# Patient Record
Sex: Male | Born: 1950 | Race: White | Hispanic: No | Smoking: Former smoker
Health system: Southern US, Community
[De-identification: ages and names within clinical notes are randomized; demographics above are authoritative.]

## PROBLEM LIST (undated history)

## (undated) DIAGNOSIS — I5189 Other ill-defined heart diseases: Secondary | ICD-10-CM

## (undated) DIAGNOSIS — C61 Malignant neoplasm of prostate: Secondary | ICD-10-CM

## (undated) DIAGNOSIS — F32A Depression, unspecified: Secondary | ICD-10-CM

## (undated) DIAGNOSIS — I10 Essential (primary) hypertension: Secondary | ICD-10-CM

## (undated) DIAGNOSIS — A6920 Lyme disease, unspecified: Secondary | ICD-10-CM

## (undated) DIAGNOSIS — E785 Hyperlipidemia, unspecified: Secondary | ICD-10-CM

## (undated) DIAGNOSIS — M109 Gout, unspecified: Secondary | ICD-10-CM

## (undated) DIAGNOSIS — Z789 Other specified health status: Secondary | ICD-10-CM

## (undated) DIAGNOSIS — F329 Major depressive disorder, single episode, unspecified: Secondary | ICD-10-CM

## (undated) DIAGNOSIS — I2510 Atherosclerotic heart disease of native coronary artery without angina pectoris: Secondary | ICD-10-CM

## (undated) HISTORY — DX: Depression, unspecified: F32.A

## (undated) HISTORY — PX: OTHER SURGICAL HISTORY: SHX169

## (undated) HISTORY — DX: Essential (primary) hypertension: I10

## (undated) HISTORY — DX: Other specified health status: Z78.9

## (undated) HISTORY — DX: Other ill-defined heart diseases: I51.89

## (undated) HISTORY — DX: Gout, unspecified: M10.9

## (undated) HISTORY — DX: Major depressive disorder, single episode, unspecified: F32.9

## (undated) HISTORY — DX: Lyme disease, unspecified: A69.20

## (undated) HISTORY — PX: CORONARY ANGIOPLASTY: SHX604

## (undated) HISTORY — DX: Atherosclerotic heart disease of native coronary artery without angina pectoris: I25.10

## (undated) HISTORY — DX: Malignant neoplasm of prostate: C61

## (undated) HISTORY — DX: Hyperlipidemia, unspecified: E78.5

---

## 2006-05-06 ENCOUNTER — Ambulatory Visit: Payer: Self-pay | Admitting: Dermatology

## 2010-10-22 DIAGNOSIS — I2510 Atherosclerotic heart disease of native coronary artery without angina pectoris: Secondary | ICD-10-CM

## 2010-10-22 HISTORY — DX: Atherosclerotic heart disease of native coronary artery without angina pectoris: I25.10

## 2010-11-22 HISTORY — PX: CORONARY ARTERY BYPASS GRAFT: SHX141

## 2010-12-22 ENCOUNTER — Inpatient Hospital Stay: Payer: Self-pay | Admitting: Internal Medicine

## 2010-12-25 HISTORY — PX: CARDIAC CATHETERIZATION: SHX172

## 2010-12-30 ENCOUNTER — Emergency Department: Payer: Self-pay | Admitting: Unknown Physician Specialty

## 2011-04-11 ENCOUNTER — Inpatient Hospital Stay: Payer: Self-pay | Admitting: Internal Medicine

## 2012-05-12 ENCOUNTER — Emergency Department: Payer: Self-pay | Admitting: Emergency Medicine

## 2012-05-18 ENCOUNTER — Emergency Department: Payer: Self-pay | Admitting: Unknown Physician Specialty

## 2012-06-24 LAB — CBC WITH DIFFERENTIAL/PLATELET
Basophil #: 0.1 10*3/uL (ref 0.0–0.1)
Basophil %: 0.9 %
Eosinophil #: 0.3 10*3/uL (ref 0.0–0.7)
Eosinophil %: 3.9 %
Lymphocyte #: 1.6 10*3/uL (ref 1.0–3.6)
MCH: 30.6 pg (ref 26.0–34.0)
MCHC: 35.2 g/dL (ref 32.0–36.0)
MCV: 87 fL (ref 80–100)
Monocyte #: 0.7 x10 3/mm (ref 0.2–1.0)
Neutrophil #: 5.4 10*3/uL (ref 1.4–6.5)
Platelet: 210 10*3/uL (ref 150–440)
RBC: 4.83 10*6/uL (ref 4.40–5.90)
RDW: 13.1 % (ref 11.5–14.5)

## 2012-06-24 LAB — COMPREHENSIVE METABOLIC PANEL
Albumin: 3.9 g/dL (ref 3.4–5.0)
Alkaline Phosphatase: 92 U/L (ref 50–136)
Anion Gap: 9 (ref 7–16)
BUN: 16 mg/dL (ref 7–18)
Bilirubin,Total: 0.3 mg/dL (ref 0.2–1.0)
Chloride: 105 mmol/L (ref 98–107)
Creatinine: 1.05 mg/dL (ref 0.60–1.30)
Glucose: 100 mg/dL — ABNORMAL HIGH (ref 65–99)
SGOT(AST): 17 U/L (ref 15–37)
SGPT (ALT): 25 U/L (ref 12–78)
Sodium: 138 mmol/L (ref 136–145)
Total Protein: 7.2 g/dL (ref 6.4–8.2)

## 2012-06-24 LAB — TROPONIN I: Troponin-I: <0.02 ng/mL

## 2012-06-24 LAB — CK TOTAL AND CKMB (NOT AT ARMC): CK-MB: 0.6 ng/mL (ref 0.5–3.6)

## 2012-06-25 ENCOUNTER — Inpatient Hospital Stay: Payer: Self-pay | Admitting: Student

## 2012-06-25 LAB — CK TOTAL AND CKMB (NOT AT ARMC)
CK, Total: 83 U/L (ref 35–232)
CK, Total: 76 U/L (ref 35–232)
CK-MB: 0.6 ng/mL (ref 0.5–3.6)

## 2012-06-25 LAB — TROPONIN I: Troponin-I: <0.02 ng/mL

## 2012-06-26 LAB — RAPID URINE DRUG SCREEN, HOSP PERFORMED
Amphetamines, Ur Screen: NEGATIVE (ref ?–1000)
Barbiturates, Ur Screen: NEGATIVE (ref ?–200)
Cocaine Metabolite,Ur ~~LOC~~: NEGATIVE (ref ?–300)
Opiate, Ur Screen: NEGATIVE (ref ?–300)

## 2012-06-26 LAB — BASIC METABOLIC PANEL
Anion Gap: 7 (ref 7–16)
BUN: 15 mg/dL (ref 7–18)
Calcium, Total: 9.3 mg/dL (ref 8.5–10.1)
Co2: 26 mmol/L (ref 21–32)
EGFR (African American): >60
Glucose: 103 mg/dL — ABNORMAL HIGH (ref 65–99)
Potassium: 4.1 mmol/L (ref 3.5–5.1)

## 2012-06-26 LAB — CBC
MCH: 30.1 pg (ref 26.0–34.0)
MCHC: 34.4 g/dL (ref 32.0–36.0)
Platelet: 209 10*3/uL (ref 150–440)
RDW: 13 % (ref 11.5–14.5)

## 2013-01-31 DIAGNOSIS — Z95820 Peripheral vascular angioplasty status with implants and grafts: Secondary | ICD-10-CM | POA: Insufficient documentation

## 2013-07-17 ENCOUNTER — Ambulatory Visit: Payer: Self-pay | Admitting: Gastroenterology

## 2013-07-21 ENCOUNTER — Emergency Department: Payer: Self-pay | Admitting: Emergency Medicine

## 2013-07-21 LAB — COMPREHENSIVE METABOLIC PANEL
BUN: 14 mg/dL (ref 7–18)
Calcium, Total: 9 mg/dL (ref 8.5–10.1)
Chloride: 105 mmol/L (ref 98–107)
Co2: 23 mmol/L (ref 21–32)
EGFR (African American): >60
Glucose: 106 mg/dL — ABNORMAL HIGH (ref 65–99)
Potassium: 4.2 mmol/L (ref 3.5–5.1)
SGOT(AST): 21 U/L (ref 15–37)
SGPT (ALT): 27 U/L (ref 12–78)
Sodium: 134 mmol/L — ABNORMAL LOW (ref 136–145)
Total Protein: 7 g/dL (ref 6.4–8.2)

## 2013-07-21 LAB — CBC
HCT: 43.1 % (ref 40.0–52.0)
HGB: 15.2 g/dL (ref 13.0–18.0)
MCH: 30.5 pg (ref 26.0–34.0)
MCHC: 35.3 g/dL (ref 32.0–36.0)
RDW: 12.8 % (ref 11.5–14.5)
WBC: 6.5 10*3/uL (ref 3.8–10.6)

## 2013-07-21 LAB — PATHOLOGY REPORT

## 2013-07-21 LAB — TROPONIN I: Troponin-I: <0.02 ng/mL

## 2013-07-22 DIAGNOSIS — G459 Transient cerebral ischemic attack, unspecified: Secondary | ICD-10-CM

## 2013-07-22 HISTORY — DX: Transient cerebral ischemic attack, unspecified: G45.9

## 2013-08-28 ENCOUNTER — Ambulatory Visit: Payer: Self-pay | Admitting: Gastroenterology

## 2013-11-05 ENCOUNTER — Ambulatory Visit: Payer: Self-pay | Admitting: Cardiovascular Disease

## 2013-11-23 ENCOUNTER — Ambulatory Visit: Payer: Self-pay | Admitting: Cardiovascular Disease

## 2013-11-23 ENCOUNTER — Encounter: Payer: Self-pay | Admitting: *Deleted

## 2013-11-30 ENCOUNTER — Emergency Department: Payer: Self-pay | Admitting: Emergency Medicine

## 2013-12-18 ENCOUNTER — Telehealth: Payer: Self-pay

## 2013-12-18 NOTE — Telephone Encounter (Signed)
LMOM FOR PT TO R/S MISSED APPT. ALSO RETURNED MAIL. NEED NEW ADDRESS

## 2014-04-22 ENCOUNTER — Ambulatory Visit: Payer: Self-pay | Admitting: Cardiovascular Disease

## 2014-05-06 ENCOUNTER — Ambulatory Visit (INDEPENDENT_AMBULATORY_CARE_PROVIDER_SITE_OTHER): Payer: BC Managed Care – PPO | Admitting: Cardiovascular Disease

## 2014-05-06 ENCOUNTER — Encounter (INDEPENDENT_AMBULATORY_CARE_PROVIDER_SITE_OTHER): Payer: Self-pay

## 2014-05-06 ENCOUNTER — Encounter: Payer: Self-pay | Admitting: Cardiovascular Disease

## 2014-05-06 VITALS — BP 140/90 | HR 64 | Ht 70.0 in | Wt 224.2 lb

## 2014-05-06 DIAGNOSIS — E785 Hyperlipidemia, unspecified: Secondary | ICD-10-CM

## 2014-05-06 DIAGNOSIS — I252 Old myocardial infarction: Secondary | ICD-10-CM

## 2014-05-06 DIAGNOSIS — R6884 Jaw pain: Secondary | ICD-10-CM

## 2014-05-06 DIAGNOSIS — Z789 Other specified health status: Secondary | ICD-10-CM

## 2014-05-06 DIAGNOSIS — I209 Angina pectoris, unspecified: Secondary | ICD-10-CM

## 2014-05-06 DIAGNOSIS — I25119 Atherosclerotic heart disease of native coronary artery with unspecified angina pectoris: Secondary | ICD-10-CM

## 2014-05-06 DIAGNOSIS — R079 Chest pain, unspecified: Secondary | ICD-10-CM

## 2014-05-06 DIAGNOSIS — I2510 Atherosclerotic heart disease of native coronary artery without angina pectoris: Secondary | ICD-10-CM

## 2014-05-06 DIAGNOSIS — G458 Other transient cerebral ischemic attacks and related syndromes: Secondary | ICD-10-CM

## 2014-05-06 DIAGNOSIS — Z951 Presence of aortocoronary bypass graft: Secondary | ICD-10-CM

## 2014-05-06 DIAGNOSIS — Z9861 Coronary angioplasty status: Secondary | ICD-10-CM

## 2014-05-06 DIAGNOSIS — Z955 Presence of coronary angioplasty implant and graft: Secondary | ICD-10-CM

## 2014-05-06 MED ORDER — EZETIMIBE 10 MG PO TABS: 10.0000 mg | ORAL_TABLET | Freq: Every day | ORAL | Status: DC

## 2014-05-06 MED ORDER — LISINOPRIL 20 MG PO TABS: 20.0000 mg | ORAL_TABLET | Freq: Every day | ORAL | Status: DC

## 2014-05-06 NOTE — Assessment & Plan Note (Signed)
Unsteady has had a difficult time with statins. This was reviewed with him. We will try zetia 10 mg again. Samples and co-pay card was given. If he is able to tolerate this, additional medication we could try would be WelChol. We do have coupon for this as well

## 2014-05-06 NOTE — Assessment & Plan Note (Signed)
Pain is atypical, worse when he twists his upper body. Likely musculoskeletal strain. Suggested he hold off on playing golf at this time. He does have very physical work and for now we'll try to avoid NSAIDs. He feels that it is slowly getting better

## 2014-05-06 NOTE — Assessment & Plan Note (Signed)
We have recommended to him that we need to be aggressive with his lipid panel. He is a nonsmoker, nondiabetic.

## 2014-05-06 NOTE — Assessment & Plan Note (Signed)
Possible TIA in October 2014. Recommended he continue on his aspirin. No significant carotid disease on ultrasound

## 2014-05-06 NOTE — Patient Instructions (Addendum)
You are doing well. Call the office if jaw pain gets worse  Please increase the lisinopril if needed for blood pressure Start with a 1/2 pill of lisinopril 20 mg daily Increase slowly up to 20 mg if needed  Please start zetia one piull per day   Please call us if you have new issues that need to be addressed before your next appt.  Your physician wants you to follow-up in: 6 months.  You will receive a reminder letter in the mail two months in advance. If you don't receive a letter, please call our office to schedule the follow-up appointment.

## 2014-05-06 NOTE — Assessment & Plan Note (Signed)
He does have occasional jaw pain. He seen a dentist. This is similar to previous anginal symptoms. We have offered a stress test but he prefers to monitor his symptoms for now and continue his followup with a dentist. We have suggested he call us if symptoms get worse, particularly with exertion

## 2014-05-06 NOTE — Assessment & Plan Note (Deleted)
We'll try to obtain the [facility] records , surgical report.

## 2014-05-06 NOTE — Progress Notes (Signed)
Patient ID: Todd Collins, male    DOB: 11-23-1950, 63 y.o.   MRN: [id number]  HPI Comments: Todd Collins is a 63 year old gentleman , patient of Todd Collins , with history of coronary artery disease, cardiac catheterization in March 2012 showing 50% mid LAD disease, 90% disease of a small diagonal, 50% proximal RCA disease, 70% mid RCA disease, 60-70% distal RCA disease, who represented several months later and was transferred to [facility] for a CABG x2 vessel, LIMA to the LAD, vein graft to the PDA 02/02/2011 who also reports having stent since then, who presents to establish care Notes indicate history of depression, He has significant statin intolerance  He reports that he has had some pain in the central mediastinal area radiating to the right underneath his ribs. It hurts when he twists his upper body. He has been playing golf recently but felt fine at the time. This pain has been stuttering, on and off, usually worse with twisting his upper body, sometimes with palpation. Occasionally has jaw pain. He has been seeing a dentist for his teeth. Previous anginal symptoms was jaw pain, neck pain.  Reports having a TIA in October 2014, blurry vision, were findings difficulty. CT scan of the head was normal. Echocardiogram and Holter performed. Holter showed normal sinus rhythm  Echocardiogram June 2012 showing ejection fraction 45% Echocardiogram October 2014 showing normal LV systolic function,  Stress test June 2012 showing mild inferior wall ischemia  Carotid ultrasound June 2012 showing no hemodynamically significant stenosis  EKG today showing normal sinus rhythm with rate 64 beats a minute, no significant ST or T wave changes Reports being unable to tolerate omeprazole as this caused cramping     Outpatient Encounter Prescriptions as of 05/06/2014  Medication Sig  . allopurinol (ZYLOPRIM) 300 MG tablet Take 150 mg by mouth daily.  Marland Kitchen aspirin EC 81 MG tablet Take 81 mg by mouth daily.    Marland Kitchen lisinopril (PRINIVIL,ZESTRIL) 20 MG tablet Take 1 tablet (20 mg total) by mouth daily.  . metoprolol (LOPRESSOR) 50 MG tablet Take 50 mg by mouth 2 (two) times daily.  . nitroGLYCERIN (NITROSTAT) 0.4 MG SL tablet Place 0.4 mg under the tongue every 5 (five) minutes as needed for chest pain.  . Omega-3 Fatty Acids (FISH OIL) 1000 MG CAPS Take by mouth 2 (two) times daily.  Marland Kitchen  lisinopril (PRINIVIL,ZESTRIL) 5 MG tablet Take 5 mg by mouth daily.    Review of Systems  Constitutional: Negative.   HENT: Negative.        Jaw pain  Eyes: Negative.   Respiratory: Negative.   Cardiovascular: Positive for chest pain.  Gastrointestinal: Negative.   Endocrine: Negative.   Musculoskeletal: Negative.   Skin: Negative.   Allergic/Immunologic: Negative.   Neurological: Negative.   Hematological: Negative.   Psychiatric/Behavioral: Negative.   All other systems reviewed and are negative.   BP 160/100  Pulse 64  Wt 224 lb 4 oz (101.719 kg)  Physical Exam  Nursing note and vitals reviewed. Constitutional: He is oriented to person, place, and time. He appears well-developed and well-nourished.  HENT:  Head: Normocephalic.  Nose: Nose normal.  Mouth/Throat: Oropharynx is clear and moist.  Eyes: Conjunctivae are normal. Pupils are equal, round, and reactive to light.  Neck: Normal range of motion. Neck supple. No JVD present.  Cardiovascular: Normal rate, regular rhythm, S1 normal, S2 normal, normal heart sounds and intact distal pulses.  Exam reveals no gallop and no friction rub.   No murmur  heard. Pulmonary/Chest: Effort normal and breath sounds normal. No respiratory distress. He has no wheezes. He has no rales. He exhibits no tenderness.  Abdominal: Soft. Bowel sounds are normal. He exhibits no distension. There is no tenderness.  Musculoskeletal: Normal range of motion. He exhibits no edema and no tenderness.  Lymphadenopathy:    He has no cervical adenopathy.  Neurological: He is  alert and oriented to person, place, and time. Coordination normal.  Skin: Skin is warm and dry. No rash noted. No erythema.  Psychiatric: He has a normal mood and affect. His behavior is normal. Judgment and thought content normal.      Assessment and Plan

## 2014-05-06 NOTE — Assessment & Plan Note (Signed)
We'll try to obtain his stent procedure records for our system.

## 2014-05-07 DIAGNOSIS — Z951 Presence of aortocoronary bypass graft: Secondary | ICD-10-CM | POA: Insufficient documentation

## 2014-05-07 NOTE — Assessment & Plan Note (Signed)
Surgical report indicates LIMA to the LAD, vein graft to the PDA We'll try to obtain intervention report as he states having a stent

## 2014-08-12 ENCOUNTER — Telehealth: Payer: Self-pay

## 2014-08-12 ENCOUNTER — Ambulatory Visit (INDEPENDENT_AMBULATORY_CARE_PROVIDER_SITE_OTHER): Payer: BC Managed Care – PPO | Admitting: Cardiovascular Disease

## 2014-08-12 ENCOUNTER — Encounter: Payer: Self-pay | Admitting: Cardiovascular Disease

## 2014-08-12 VITALS — BP 160/80 | HR 77 | Ht 68.5 in | Wt 216.5 lb

## 2014-08-12 DIAGNOSIS — Z889 Allergy status to unspecified drugs, medicaments and biological substances status: Secondary | ICD-10-CM

## 2014-08-12 DIAGNOSIS — Z951 Presence of aortocoronary bypass graft: Secondary | ICD-10-CM

## 2014-08-12 DIAGNOSIS — Z955 Presence of coronary angioplasty implant and graft: Secondary | ICD-10-CM

## 2014-08-12 DIAGNOSIS — R079 Chest pain, unspecified: Secondary | ICD-10-CM

## 2014-08-12 DIAGNOSIS — I25119 Atherosclerotic heart disease of native coronary artery with unspecified angina pectoris: Secondary | ICD-10-CM

## 2014-08-12 DIAGNOSIS — Z789 Other specified health status: Secondary | ICD-10-CM

## 2014-08-12 DIAGNOSIS — R6884 Jaw pain: Secondary | ICD-10-CM

## 2014-08-12 DIAGNOSIS — E785 Hyperlipidemia, unspecified: Secondary | ICD-10-CM

## 2014-08-12 MED ORDER — NITROGLYCERIN 0.4 MG SL SUBL: 0.4000 mg | SUBLINGUAL_TABLET | SUBLINGUAL | Status: DC | PRN

## 2014-08-12 MED ORDER — CLOPIDOGREL BISULFATE 75 MG PO TABS: 75.0000 mg | ORAL_TABLET | Freq: Every day | ORAL | Status: DC

## 2014-08-12 MED ORDER — AMLODIPINE BESYLATE 10 MG PO TABS: 10.0000 mg | ORAL_TABLET | Freq: Every day | ORAL | Status: DC

## 2014-08-12 NOTE — Progress Notes (Signed)
Patient ID: Todd Collins, male    DOB: 1951/08/04, 63 y.o.   MRN: [id number]  HPI Comments: [name] is a 63 year old gentleman , patient of Dr. Ola Spurr , with history of coronary artery disease, cardiac catheterization in March 2012 showing 50% mid LAD disease, 90% disease of a small diagonal, 50% proximal RCA disease, 70% mid RCA disease, 60-70% distal RCA disease, who represented several months later and was transferred to [facility] for a CABG x2 vessel, LIMA to the LAD, vein graft to the PDA 02/02/2011 who also reports having stent since then, who presents for routine followup Notes indicate history of depression, He has significant statin intolerance  In followup today, he reports that since last Thursday, one week ago, he has been having jaw pain, mouth pain at rest. Symptoms come on typically at nighttime sometimes in his upper jaw, sometimes lower jaw, sometimes on the opposite side. Since his symptoms started, he started exercising again, walking 30 minutes at a time with no reproducible jaw pain or chest pain. Previously when he had angina, symptoms worse with exertion. Also reports that he has had no symptoms while at work when he is busy. He works with heavy machinery   on previous visits, he had occasional jaw pain and was  seeing a dentist for his teeth. Previous anginal symptoms was jaw pain, neck pain.  he does not think it is his heart. He has not taken any nitroglycerin. Sometimes symptoms have been severe He does report that blood pressure has been elevated at home  Reports having a TIA in October 2014, blurry vision, were findings difficulty. CT scan of the head was normal. Echocardiogram and Holter performed. Holter showed normal sinus rhythm  Echocardiogram June 2012 showing ejection fraction 45% Echocardiogram October 2014 showing normal LV systolic function,  Stress test June 2012 showing mild inferior wall ischemia  Carotid ultrasound June 2012 showing no  hemodynamically significant stenosis  EKG today showing normal sinus rhythm with rate 77 beats a minute, no significant ST or T wave changes Reports being unable to tolerate omeprazole as this caused cramping     Outpatient Encounter Prescriptions as of 08/12/2014  Medication Sig  . allopurinol (ZYLOPRIM) 300 MG tablet Take 150 mg by mouth daily.  Marland Kitchen aspirin EC 81 MG tablet Take 81 mg by mouth daily.   Marland Kitchen lisinopril (PRINIVIL,ZESTRIL) 20 MG tablet Take 1 tablet (20 mg total) by mouth daily.  . metoprolol (LOPRESSOR) 50 MG tablet Take 50 mg by mouth 2 (two) times daily.  . nitroGLYCERIN (NITROSTAT) 0.4 MG SL tablet Place 1 tablet (0.4 mg total) under the tongue every 5 (five) minutes as needed for chest pain.  . Omega-3 Fatty Acids (FISH OIL) 1000 MG CAPS Take by mouth 2 (two) times daily.    Review of Systems  Constitutional: Negative.   HENT: Negative.        Jaw pain  Eyes: Negative.   Respiratory: Negative.   Cardiovascular: Negative.   Gastrointestinal: Negative.   Endocrine: Negative.   Musculoskeletal: Negative.   Skin: Negative.   Allergic/Immunologic: Negative.   Neurological: Negative.   Hematological: Negative.   Psychiatric/Behavioral: Negative.   All other systems reviewed and are negative.   BP 160/80  Pulse 77  Ht 5' 8.5" (1.74 m)  Wt 216 lb 8 oz (98.204 kg)  BMI 32.44 kg/m2  Physical Exam  Nursing note and vitals reviewed. Constitutional: He is oriented to person, place, and time. He appears well-developed and well-nourished.  HENT:  Head: Normocephalic.  Nose: Nose normal.  Mouth/Throat: Oropharynx is clear and moist.  Eyes: Conjunctivae are normal. Pupils are equal, round, and reactive to light.  Neck: Normal range of motion. Neck supple. No JVD present.  Cardiovascular: Normal rate, regular rhythm, S1 normal, S2 normal, normal heart sounds and intact distal pulses.  Exam reveals no gallop and no friction rub.   No murmur heard. Pulmonary/Chest:  Effort normal and breath sounds normal. No respiratory distress. He has no wheezes. He has no rales. He exhibits no tenderness.  Abdominal: Soft. Bowel sounds are normal. He exhibits no distension. There is no tenderness.  Musculoskeletal: Normal range of motion. He exhibits no edema and no tenderness.  Lymphadenopathy:    He has no cervical adenopathy.  Neurological: He is alert and oriented to person, place, and time. Coordination normal.  Skin: Skin is warm and dry. No rash noted. No erythema.  Psychiatric: He has a normal mood and affect. His behavior is normal. Judgment and thought content normal.      Assessment and Plan

## 2014-08-12 NOTE — Assessment & Plan Note (Signed)
Worsening jaw pain symptoms concerning for angina. Plan as above

## 2014-08-12 NOTE — Assessment & Plan Note (Signed)
Etiology of his jaw pain is unclear. Concerning given previous angina presented with jaw pain. He does not want cardiac workup at this time but would like to treat his blood pressure. We will start him on amlodipine and ranexa. Also suggested he take nitroglycerin for symptoms. If symptoms do not improve or get worse or present with exertion, recommended he go to the emergency room

## 2014-08-12 NOTE — Assessment & Plan Note (Signed)
Most recent lipid panel not available. We'll discuss retrying a statin on his next visit

## 2014-08-12 NOTE — Patient Instructions (Addendum)
Your next appointment will be scheduled in our new office located at :  [facility]  [address], [place], [place] [address]   Please start amlodipine one a day for blood pressure  Start ranexa 500 mg twice a day for one week, Then up to 1000 mg twice a day   Please call us if you have new issues that need to be addressed before your next appt.  Your physician wants you to follow-up in: 1 month.

## 2014-08-12 NOTE — Telephone Encounter (Signed)
Pt states he is having jaw pain and his BP is elevated.

## 2014-08-12 NOTE — Assessment & Plan Note (Signed)
We'll discuss his cholesterol medication with him in followup.

## 2014-08-12 NOTE — Assessment & Plan Note (Signed)
We'll suggest that he start on Plavix in addition to his aspirin

## 2014-08-12 NOTE — Telephone Encounter (Signed)
Spoke w/ pt.  He reports that his BP is 177/97.  C/o jaw pain and in his teeth since last Thursday. Denies chest pain or SOB, but states that he woke up a couple of nights ago in a "cold sweat".  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 He has taken 2 aspirin 325 mg to help w/ the pain.  Offer pt appt this afternoon w/ Dr. Rockey Situ.  Advised him that if sx become emergent, to call 911 or proceed to nearest ED.  He verbalizes understanding.

## 2014-08-12 NOTE — Assessment & Plan Note (Signed)
Previously had stuttering jaw pain. Now with severe jaw pain at rest. Etiology not clear. Plan as above

## 2014-08-13 ENCOUNTER — Telehealth: Payer: Self-pay

## 2014-08-13 NOTE — Telephone Encounter (Signed)
Pt thinks he is allergic to amlodipine, woke up with a rash on this shoulder and is getting worse, also states he took 3 Nitro last night.

## 2014-08-13 NOTE — Telephone Encounter (Signed)
I spoke with the patient. He states that he took one dose of amlodipine yesterday and today developed 2 reddened areas on his shoulder area with itching and some redness to his face. He thinks amlodipine sounds familiar and that he may have tried this about 3 years ago and wasn't on it very long. He also reports that he was having jaw pain last night that he took 3 NTG for without much relief. He took additional aspirin and felt better. The patient has been having jaw pain per Dr. Donivan Scull office note. 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 He will start this tomorrow. He reports that his BP has still been up. He took his amlodipine last night about 7 pm and at 8:30 pm his BP was 169/98. This morning his BP @ 5am was 151/80 with a HR ~ 78. He took his morning medications around 6am and his BP was 153/90 at 6:30 am with a HR of 63. The patient usually takes his lisinopril at night. I have advised that he stop amlodipine and take increase lisinopril to 40 mg daily over the weekend. He will start Ranexa tomorrow. He will take benadryl for his rash. I have advised that he record his BP's over the weekend and call on Monday with the readings. If his jaw pain returns and is not relieved with NTG or initiation of ranexa, he has been advised to report to the ER for evaluation. He voices understanding.

## 2014-08-25 ENCOUNTER — Telehealth: Payer: Self-pay | Admitting: Cardiovascular Disease

## 2014-08-25 NOTE — Telephone Encounter (Signed)
Left message for pt to call back  °

## 2014-08-25 NOTE — Telephone Encounter (Signed)
Pt is  Ranexa, was taking 500 mg, started  Sunday he is now taking 1000mg  twice a day, but it has gotten him dizzy, and he is Not sleeping, urine isnt right, also it is messing with memory. Please advise.  pt is now taking AmlodIPine, he thought he was allergic to it, turned out it was a bug bite.

## 2014-08-25 NOTE — Telephone Encounter (Signed)
Spoke w/ pt.  He reports that he had some sleepiness and constipation on the Ranexa 500mg . Since increasing to 1000 mg, he is c/o nausea, dizziness, a "twinge of jaw pain" about an hour after taking, and that his BP has increased.  He states that he will not take it anymore after today, he would like to monitor his BP and call back if he has any more sx. Advised him that I will make Dr. Rockey Situ aware in the event that he wants to make any changes.

## 2014-08-26 NOTE — Telephone Encounter (Signed)
Left message on pt's vm. Asked him to call back w/ any questions or concerns.

## 2014-08-26 NOTE — Telephone Encounter (Signed)
Okay to stop if there are side effects

## 2014-09-13 ENCOUNTER — Encounter: Payer: Self-pay | Admitting: Cardiovascular Disease

## 2014-09-13 ENCOUNTER — Ambulatory Visit (INDEPENDENT_AMBULATORY_CARE_PROVIDER_SITE_OTHER): Payer: BC Managed Care – PPO | Admitting: Cardiovascular Disease

## 2014-09-13 VITALS — BP 138/64 | HR 80 | Ht 68.5 in | Wt 213.5 lb

## 2014-09-13 DIAGNOSIS — R6884 Jaw pain: Secondary | ICD-10-CM

## 2014-09-13 DIAGNOSIS — I159 Secondary hypertension, unspecified: Secondary | ICD-10-CM

## 2014-09-13 DIAGNOSIS — Z889 Allergy status to unspecified drugs, medicaments and biological substances status: Secondary | ICD-10-CM

## 2014-09-13 DIAGNOSIS — Z951 Presence of aortocoronary bypass graft: Secondary | ICD-10-CM

## 2014-09-13 DIAGNOSIS — Z789 Other specified health status: Secondary | ICD-10-CM

## 2014-09-13 DIAGNOSIS — R000 Tachycardia, unspecified: Secondary | ICD-10-CM

## 2014-09-13 DIAGNOSIS — E785 Hyperlipidemia, unspecified: Secondary | ICD-10-CM

## 2014-09-13 DIAGNOSIS — I25119 Atherosclerotic heart disease of native coronary artery with unspecified angina pectoris: Secondary | ICD-10-CM

## 2014-09-13 MED ORDER — AMLODIPINE BESYLATE 10 MG PO TABS: 10.0000 mg | ORAL_TABLET | Freq: Every day | ORAL | Status: DC

## 2014-09-13 MED ORDER — LISINOPRIL 20 MG PO TABS: 20.0000 mg | ORAL_TABLET | Freq: Every day | ORAL | Status: DC

## 2014-09-13 MED ORDER — METOPROLOL TARTRATE 50 MG PO TABS: 75.0000 mg | ORAL_TABLET | Freq: Two times a day (BID) | ORAL | Status: DC

## 2014-09-13 NOTE — Assessment & Plan Note (Signed)
We have recommended weight loss, regular exercise. Will likely need to manage his cholesterol without medications. We did discuss some of the new agents. He would likely not qualify at this time

## 2014-09-13 NOTE — Assessment & Plan Note (Signed)
Currently with no symptoms of angina. No further workup at this time. Continue current medication regimen. 

## 2014-09-13 NOTE — Progress Notes (Signed)
Patient ID: Todd Collins, male    DOB: [birth year], 63 y.o.   MRN: [id number]  HPI Comments: Todd Collins is a 63 year old gentleman , patient of Dr. Ola Spurr , with history of coronary artery disease, cardiac catheterization in March 2012 showing 50% mid LAD disease, 90% disease of a small diagonal, 50% proximal RCA disease, 70% mid RCA disease, 60-70% distal RCA disease, who represented several months later and was transferred to [facility] for a CABG x2 vessel, LIMA to the LAD, vein graft to the PDA 02/02/2011 who also reports having stent since then, who presents for routine followup of his coronary artery disease Notes indicate history of depression, He has significant statin intolerance  In follow-up today, he reports that he feels better, blood pressure has improved. He feels that his previous episodes of jaw pain was from eating salty food, drinking alcohol. Water seemed to make his symptoms better. Symptoms not associated with exertion. 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 He is no longer taking ranexa as this caused side effects (uination issues)  Previous anginal symptoms was jaw pain, neck pain. No recent symptoms concerning for angina. He has not taken any nitroglycerin.  He is tolerating amlodipine which was started on his last clinic visit. Blood pressure runs 209 up to 470 systolic  EKG on today's visit shows normal sinus rhythm with rate 80 bpm, no significant ST or T-wave changes  Other past medical history Reports having a TIA in October 2014, blurry vision, were findings difficulty. CT scan of the head was normal. Echocardiogram and Holter performed. Holter showed normal sinus rhythm  Echocardiogram June 2012 showing ejection fraction 45% Echocardiogram October 2014 showing normal LV systolic function,  Stress test June 2012 showing mild inferior wall ischemia  Carotid ultrasound June 2012 showing no hemodynamically significant  stenosis     Outpatient Encounter Prescriptions as of 09/13/2014  Medication Sig  . allopurinol (ZYLOPRIM) 300 MG tablet Take 150 mg by mouth daily.  Marland Kitchen amLODipine (NORVASC) 10 MG tablet Take 1 tablet (10 mg total) by mouth daily.  Marland Kitchen aspirin EC 81 MG tablet Take 81 mg by mouth daily.   Marland Kitchen lisinopril (PRINIVIL,ZESTRIL) 20 MG tablet Take 1 tablet (20 mg total) by mouth daily.  . metoprolol (LOPRESSOR) 50 MG tablet Take 50 mg by mouth 2 (two) times daily.  . nitroGLYCERIN (NITROSTAT) 0.4 MG SL tablet Place 1 tablet (0.4 mg total) under the tongue every 5 (five) minutes as needed for chest pain.  . Omega-3 Fatty Acids (FISH OIL) 1000 MG CAPS Take by mouth 2 (two) times daily.    Review of Systems  Constitutional: Negative.   HENT: Negative.        Periodic Jaw pain  Eyes: Negative.   Respiratory: Negative.   Cardiovascular: Negative.   Gastrointestinal: Negative.   Musculoskeletal: Negative.   Neurological: Negative.   Hematological: Negative.   Psychiatric/Behavioral: Negative.   All other systems reviewed and are negative.   BP 138/64 mmHg  Pulse 80  Ht 5' 8.5" (1.74 m)  Wt 213 lb 8 oz (96.843 kg)  BMI 31.99 kg/m2  Physical Exam  Constitutional: He is oriented to person, place, and time. He appears well-developed and well-nourished.  HENT:  Head: Normocephalic.  Nose: Nose normal.  Mouth/Throat: Oropharynx is clear and moist.  Eyes: Conjunctivae are normal. Pupils are equal, round, and reactive to light.  Neck: Normal range of motion. Neck supple. No JVD present.  Cardiovascular: Normal  rate, regular rhythm, S1 normal, S2 normal and intact distal pulses.  Exam reveals no gallop and no friction rub.   Murmur heard.  Systolic murmur is present with a grade of 2/6  Pulmonary/Chest: Effort normal and breath sounds normal. No respiratory distress. He has no wheezes. He has no rales. He exhibits no tenderness.  Abdominal: Soft. Bowel sounds are normal. He exhibits no  distension. There is no tenderness.  Musculoskeletal: Normal range of motion. He exhibits no edema or tenderness.  Lymphadenopathy:    He has no cervical adenopathy.  Neurological: He is alert and oriented to person, place, and time. Coordination normal.  Skin: Skin is warm and dry. No rash noted. No erythema.  Psychiatric: He has a normal mood and affect. His behavior is normal. Judgment and thought content normal.      Assessment and Plan   Nursing note and vitals reviewed.

## 2014-09-13 NOTE — Assessment & Plan Note (Signed)
He reports that he did not want to take zetia.  Also reports statin intolerance

## 2014-09-13 NOTE — Patient Instructions (Signed)
You are doing well.  For blood pressure or high heart rate, ok to take extra metoprolol at night  Please call us if you have new issues that need to be addressed before your next appt.  Your physician wants you to follow-up in: 6 months.  You will receive a reminder letter in the mail two months in advance. If you don't receive a letter, please call our office to schedule the follow-up appointment.

## 2014-09-13 NOTE — Assessment & Plan Note (Signed)
Currently with no symptoms of angina. Previous jaw pain likely atypical in nature, noncardiac

## 2014-09-23 ENCOUNTER — Emergency Department: Payer: Self-pay | Admitting: Emergency Medicine

## 2014-09-23 LAB — CBC WITH DIFFERENTIAL/PLATELET
Basophil #: 0.1 10*3/uL (ref 0.0–0.1)
Basophil %: 0.8 %
Eosinophil #: 0.2 10*3/uL (ref 0.0–0.7)
Eosinophil %: 1.7 %
HCT: 42.9 % (ref 40.0–52.0)
HGB: 14.3 g/dL (ref 13.0–18.0)
LYMPHS ABS: 1.6 10*3/uL (ref 1.0–3.6)
Lymphocyte %: 18.6 %
MCH: 29.9 pg (ref 26.0–34.0)
MCHC: 33.4 g/dL (ref 32.0–36.0)
MCV: 90 fL (ref 80–100)
MONO ABS: 0.5 x10 3/mm (ref 0.2–1.0)
MONOS PCT: 5.2 %
NEUTROS PCT: 73.7 %
Neutrophil #: 6.5 10*3/uL (ref 1.4–6.5)
PLATELETS: 262 10*3/uL (ref 150–440)
RBC: 4.8 10*6/uL (ref 4.40–5.90)
RDW: 12.5 % (ref 11.5–14.5)
WBC: 8.8 10*3/uL (ref 3.8–10.6)

## 2014-09-23 LAB — COMPREHENSIVE METABOLIC PANEL
ALK PHOS: 90 U/L
ALT: 27 U/L
ANION GAP: 9 (ref 7–16)
AST: 27 U/L (ref 15–37)
Albumin: 3.9 g/dL (ref 3.4–5.0)
BILIRUBIN TOTAL: 0.4 mg/dL (ref 0.2–1.0)
BUN: 20 mg/dL — ABNORMAL HIGH (ref 7–18)
CALCIUM: 8.9 mg/dL (ref 8.5–10.1)
CHLORIDE: 103 mmol/L (ref 98–107)
CREATININE: 1.28 mg/dL (ref 0.60–1.30)
Co2: 25 mmol/L (ref 21–32)
EGFR (African American): >60
EGFR (Non-African Amer.): >60
Glucose: 111 mg/dL — ABNORMAL HIGH (ref 65–99)
Osmolality: 277 (ref 275–301)
Potassium: 4.1 mmol/L (ref 3.5–5.1)
SODIUM: 137 mmol/L (ref 136–145)
Total Protein: 7.6 g/dL (ref 6.4–8.2)

## 2014-09-23 LAB — TROPONIN I

## 2015-02-08 NOTE — Consult Note (Signed)
PATIENT NAME:  JULIO, ZAPPIA MR#:  [id number] DATE OF BIRTH:  1951/02/06  DATE OF CONSULTATION:  06/25/2012  REFERRING PHYSICIAN:  Dr. Tish Men at [place]: Dr. Levonne Hubert  CONSULTING PHYSICIAN:  Daliyah Sramek D. Trev Boley, MD  INDICATION: Unstable angina.   HISTORY OF PRESENT ILLNESS: Mr. Clinger is a 64 year old white male known to me prior work-up for coronary artery disease status post coronary bypass, angioplasty and stenting, hyperlipidemia, hypertension who presents with recurrent symptoms of chest pain, angina, jaw pain, weakness, and fatigue. Patient had worsening symptoms on and off over 3 or 4 days, it got progressively worse. Finally came to the Emergency Room for evaluation. He was subsequently admitted, ruled out for myocardial infarction. EKG was equivocal but he continued to have recurrent symptoms of chest pain, jaw pain consistent with angina so cardiac consultation was recommended. During his admission he was severely hypertensive and required direct therapy.   REVIEW OF SYSTEMS: No blackout spells, syncope. No nausea, vomiting. No fever. No chills. No sweats. No weight loss. No weight gain. No hemoptysis, hematemesis. No bright red blood per rectum.   PAST MEDICAL HISTORY:  1. Coronary artery disease.  2. Gout. 3. Hypertension. 4. Depression.   PAST SURGICAL HISTORY:  1. Coronary artery bypass graft. 2. Right hand surgery. 3. Wisdom teeth extraction.   ALLERGIES: None.   MEDICATIONS:  1. Aspirin 81 mg a day. 2. Lisinopril 5 a day.  3. Lipitor 20 one day of the week.  4. Zetia. 5. Plavix 75 a day.  6. Nitroglycerin 0.4 mg p.r.n.  7. Metoprolol 50 mg twice a day.  8. Fish oil 1000 mg twice a day. 9. Allopurinol 150 daily.  FAMILY HISTORY: Coronary artery disease, valve replacement, asthma, diverticulosis, aneurysm, coronary artery disease.   SOCIAL HISTORY: Remote smoking, occasional alcohol consumption. Just started a new job at the [employer].  PHYSICAL  EXAMINATION:  VITAL SIGNS: Blood pressure 190/90, pulse 80, respiratory rate 16, afebrile.   HEENT: Normocephalic, atraumatic. Pupils reactive to light.  NECK: Supple. No jugular venous distention, bruit, adenopathy.   LUNGS: Clear to auscultation and percussion. No significant wheeze, rhonchi, rale.   HEART: Regular rate and rhythm. No significant murmur, gallops, or rubs.   ABDOMEN: Benign.   EXTREMITY: Within normal limits.   NEUROLOGIC: Intact.   SKIN: Normal.   LABORATORY, DIAGNOSTIC AND RADIOLOGICAL DATA: White count 8, hemoglobin 14.8, hematocrit 41.9, platelet count 210, glucose 100, BUN 16, creatinine 1.05, sodium 138, potassium 3.9, chloride 105, CO2 24. LFTs negative. PT/INR negative. EKG: Normal sinus rhythm, nonspecific ST-T wave changes. Chest x-ray: Normal, sternum wires, otherwise negative. Echocardiogram done 03/2011 shows ejection fraction of 45% with mild tricuspid regurgitation, otherwise normal.   ASSESSMENT:  1. Unstable angina.  2. Angina.  3. Chest pain.  4. Coronary artery disease.  5. Hyperlipidemia.  6. Gout. 7. Hypertension.   PLAN: Continue current therapy. Agree with rule out for myocardial infarction. Continue anticoagulation, aspirin, beta blockers, ACE inhibitor. Would recommend cardiac catheterization to further delineate the patient's cardiac risk. Would base further evaluation on results of cardiac catheterization.  ____________________________ Loran Senters Clayborn Bigness, MD ddc:cms D: 06/25/2012 16:04:48 ET T: 06/26/2012 08:11:22 ET JOB#: [id number]  cc: Julyanna Scholle D. Clayborn Bigness, MD, <Dictator> Yolonda Kida MD ELECTRONICALLY SIGNED 07/29/2012 15:12
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
## 2015-02-08 NOTE — Discharge Summary (Signed)
PATIENT NAME:  Todd Collins, Todd Collins MR#:  [id number] DATE OF BIRTH:  1951-10-05  DATE OF ADMISSION:  06/25/2012 DATE OF DISCHARGE:  06/26/2012  PRIMARY CARE PHYSICIAN: Dr. Kayleen Memos at [place]: Dr. Tish Men at [place]: Left jaw and neck pain.   CONSULTANT: Dr. Clayborn Bigness from cardiology.   DISCHARGE DIAGNOSES:  1. Left jaw pain, possibly anginal equivalent.  2. History of coronary artery disease status post myocardial infarction, coronary artery bypass graft and stenting in 2012.  3. Gout.  4. Hypertension.  5. Depression.   DISCHARGE MEDICATIONS:  1. Aspirin 81 mg daily.  2. Nitroglycerin sublingual 0.4 mg under tongue every five minutes as needed for chest pain, max three doses then call EMS.  3. Metoprolol tartrate 50 mg 1 tab 2 times a day.  4. Plavix 75 mg daily.  5. Fish oral 1000 mg two times a day.  6. Allopurinol 150 mg once a day.  7. Lipitor 20 mg 1 tab orally every seven days.  8. Imdur 30 mg extended release 1 tab once a day in the morning.  9. Lisinopril 5 mg daily.   DIET: Low sodium.   ACTIVITY: As tolerated.   FOLLOW UP: Please follow up with your primary care physician within a week. Please follow with your cardiologist as previously scheduled I believe on the 20th of this month.   DISPOSITION: Home.   CODE STATUS: Patient is FULL CODE.   HISTORY OF PRESENT ILLNESS: For full details of history and physical, please see the dictation on 06/25/2012 by Dr. Levonne Hubert, but briefly, this is a pleasant 64 year old male with history of coronary artery disease status post CABG who presents with jaw pain and neck pain. The pain was not associated with exertion. The pain was described initially as toothache almost originating at the base of the chin radiating to the eye, relieved after nitroglycerin. Of note patient has had a stress test recently at [facility] which was a nuclear exercise stress test and was told it was negative  study. On arrival he was noted to have significantly elevated blood pressure of 180s/90s and with treatment of that in the ER the pain diminished. He was admitted to the hospitalist service for further evaluation and management.   LABORATORY, DIAGNOSTIC AND RADIOLOGICAL DATA: Troponins were negative x3. CK-MB initially 0.6, then 0.6, then less than 0.5. CK total within normal limits x3. Urine toxicology negative. CBC within normal limits on arrival and on 09/05. INR within normal limits. LFTs within normal limits. Basic metabolic panel: BUN 16, creatinine 1.05, sodium 138, potassium 3.9, glucose 100. CT of maxillofacial area without contrast showing new soft tissue density material in the left maxillary sinus which may be due to inflammation or the patient's previous trauma. No fracture of the facial bones other than the nasal fracture are demonstrated. Nasal septum is deviated toward the right but does not appear obstructed. Chest x-ray, one view, showing borderline to mild cardiomegaly, CABG changes, no acute cardiopulmonary disease evident.   HOSPITAL COURSE: Patient was admitted to hospitalist service. Patient has stated this pain reminded him of the previous myocardial infarction he had as he had a very similar presentation with jaw pain, neck pain. Given above patient was admitted to hospitalist service. Cardiology was consulted and patient was seen by Dr. Clayborn Bigness. Patient had short bout of similar pain yesterday, however, that has resolved since. Cardiac catheterization was recommended and ordered by Dr. Clayborn Bigness, however, patient refused to undergo  the study saying that although Dr. Clayborn Bigness can do to the catheterization he cannot offer me any therapeutic options as his doctors had told him that uncorrected lesions in his coronary vessels are smallish and not amenable to stenting easily. Therefore the patient refused the catheterization and as he had no further symptoms stated he would follow up with  [facility]. Urine drug toxicology was negative and he also has had a recent negative stress test here. He has an appointment with [facility] nephrology in a couple of weeks and he was strongly recommended to follow up. His aspirin, Plavix, statin, beta blocker and ACE inhibitors have been resumed. He was also discharged on low dose Imdur to see if it helps with anginal symptoms. He was recommended to called EMS as soon as he has recurrent pains requiring more than one episode of ingestion of nitroglycerin. At this point he is neck/jaw pain free and will be discharged with outpatient follow up as described above. His blood pressure also has been normalized here with his outpatient medications.   TOTAL TIME SPENT: 35 minutes.   CODE STATUS: FULL CODE.   ____________________________ Vivien Presto, MD sa:cms D: 06/26/2012 15:57:37 ET T: 06/27/2012 13:36:29 ET JOB#: [id number]  cc: Vivien Presto, MD, <Dictator> Dr. Doran Clay at [facility]  Dr. Tish Men at [facility], Cardiology Department.   Vivien Presto MD ELECTRONICALLY SIGNED 07/02/2012 0:25

## 2015-02-11 NOTE — Consult Note (Signed)
PATIENT NAME:  Todd Collins, Todd Collins MR#:  [id number] DATE OF BIRTH:  13-Aug-1951  DATE OF CONSULTATION:  07/21/2013  PRIMARY CARE PHYSICIAN: Dr. Ola Spurr.  CONSULTING PHYSICIAN:  Berlene Dixson A. Posey Pronto, MD  CHIEF COMPLAINT: Blurred vision this morning.   HISTORY OF PRESENT ILLNESS: Todd Collins is a 64 year old Caucasian gentleman with a history of CAD, depression, hypertension, hypercholesterolemia, and a history of migraine headaches, comes to the Emergency Room after he woke up around 5:30 in the morning, tried to look up something in his cell phone, and he noted some blurred vision in both his eyes. The patient took his routine ends. He ate a little bit of breakfast and he was going to work, and he was on his way to work once his vision improved. However, he started feeling he was not getting his words correctly. Came to the Emergency Room and was evaluated by the ER physician, along with Teleneurology for a possible TIA versus sleep deprivation. Upon asking the patient, he reports not sleeping well, not able to maintain several hours of sleep for the last 2 weeks, and is feeling exhausted, associated with some symptoms of dizziness and nausea.   Next, in the Emergency Room the patient's CT head was negative. He had no neuro deficits, and his vision was back to his baseline. He does wear reading glasses. He denied any feeling of a curtain falling in front of his eyes or any total blackout. He only complained of some blurred vision.   PAST MEDICAL HISTORY: 1.  History of MI. 2.  History of migraine headaches.  3.  Depression.  4.  Hypertension.  5.  Hypercholesterolemia.  6.  History of cardiac stents, and status post CABG.   ALLERGIES: WELLBUTRIN.   MEDICATIONS: 1.  Omeprazole 40 mg p.o. daily.  2.  Nitroglycerin 0.4 mg sublingual as needed.  3.  Metoprolol titrate 50 mg 1 tablet b.i.d.  4.  Lisinopril 5 mg daily.  5.  Fish oil 1000 mg b.i.d.  6.  Aspirin 325 mg p.o. daily.  7.  Ambien 5 mg daily  at bedtime.  8.  Allopurinol 100 mg, 1-1/2 tablets, which is 150 mg daily.   SOCIAL HISTORY: The patient works for the [employer]. He denies any history of smoking or alcohol.   FAMILY HISTORY: Positive for hypertension and heart disease in the family.   REVIEW OF SYSTEMS:  CONSTITUTIONAL: No fever, fatigue, weakness.  EYES: Positive for blurred vision. No glaucoma.no cataract.  ENT: No tinnitus, ear pain, hearing loss or postnasal drip.  RESPIRATORY: No cough, wheeze, hemoptysis or any pneumonia.  CARDIOVASCULAR: No chest pain, orthopnea, edema.  GASTROINTESTINAL: No nausea, vomiting, diarrhea, abdominal pain.  GENITOURINARY: No dysuria or hematuria.  ENDOCRINE: No polyuria, nocturia or thyroid problems.  HEMATOLOGY: No anemia or easy bruising.  SKIN: No acne or rash.  MUSCULOSKELETAL: Positive for back pain. No swelling or gout.  NEUROLOGIC: Positive for mild blurred vision. Otherwise no numbness, weakness, dysarthria, tremors or vertigo.  PSYCHIATRIC: No anxiety or depression.   All other systems reviewed and negative.   PHYSICAL EXAMINATION: GENERAL: The patient is awake, alert, oriented x 3, not in acute distress.  VITAL SIGNS: Afebrile. Blood pressure is 134/84, sats are 95% on room air.  HEENT: Atraumatic, normocephalic. Pupils are equal, round and reactive to light and accommodation. EOM intact. Oral mucosa is moist. No facial droop.  NECK: Supple. No JVD. No carotid bruit.  RESPIRATORY: Clear to auscultation bilaterally. No rales, rhonchi, respiratory distress or  labored breathing.   HEART: Both the heart sounds are normal. Rate, rhythm regular. PMI not lateralized. Chest is nontender.  EXTREMITIES: Good pedal pulses, good femoral pulses. No lower extremity edema.  ABDOMEN: Soft, benign, nontender. No organomegaly. Positive bowel sounds.  NEUROLOGIC: The patient is right-handed. No facial droop. Cranial nerves II-XII appear grossly intact. No neuro deficit  clinically present.  REFLEXES: Deep tendon jerks are 1+, both upper and lower extremities. Plantars are down-going bilaterally.   CT of the head: No acute intracranial abnormality.   CHEST X-RAY: No evidence of CHF or pneumonia.   CBC within normal limits.   Comprehensive metabolic panel within normal limits.   Troponin is less than 0.02.   EKG showed sinus rhythm.   ASSESSMENT: 5 Todd Collins with a history of coronary artery disease, hypertension and hyperlipidemia, comes in with:  1.  Change in blurred vision, which was resolved in the setting of sleep deprivation for about  2 weeks on and off. Suspect transient ischemic attack versus sleep deprivation. CT head was negative. The patient remained neurologically intact in the ER. Labs ae stable. Vitals stable. Received aspirin 325 daily. The patient was seen and evaluated by Teleneurology. Recommend further workup, however the patient appears to be at baseline. I did advise patient to get his eyes checked since it has been awhile since he has had his eyes checked.  3.  Sleep deprivation: The patient has been having difficulty maintaining several hours of sleep lately. Will try Ambien.  4.  Hypertension: Continue metoprolol.  5.  Gastroesophageal reflux disease/gastritis per EGD, which was recently done. Continue Prilosec 40 mg daily.  6.  History of coronary artery disease: Remains stable.   7.  The patient was advised to take aspirin 325 mg p.o. daily after meals and follow up with  Dr. Ola Spurr in the next couple of days. This was discussed with Dr. Ola Spurr, who was agreeable to it. The patient also is recommended to see an ophthalmologist and if his symptoms continued to worsen. Return back to the Emergency Room.   TIME SPENT: Fifty-five minutes.   ____________________________ Hart Rochester Posey Pronto, MD sap:dm D: 07/21/2013 12:47:41 ET T: 07/21/2013 13:04:42 ET JOB#: [id number]  cc: Bailie Christenbury A. Posey Pronto, MD, <Dictator> Cheral Marker.  Ola Spurr, MD Ilda Basset MD ELECTRONICALLY SIGNED 07/21/2013 14:26

## 2015-05-25 DIAGNOSIS — G4700 Insomnia, unspecified: Secondary | ICD-10-CM | POA: Insufficient documentation

## 2015-09-08 ENCOUNTER — Ambulatory Visit (INDEPENDENT_AMBULATORY_CARE_PROVIDER_SITE_OTHER): Payer: BC Managed Care – PPO | Admitting: Cardiovascular Disease

## 2015-09-08 ENCOUNTER — Encounter: Payer: Self-pay | Admitting: Cardiovascular Disease

## 2015-09-08 VITALS — BP 149/85 | HR 68 | Ht 68.5 in | Wt 219.2 lb

## 2015-09-08 DIAGNOSIS — Z955 Presence of coronary angioplasty implant and graft: Secondary | ICD-10-CM | POA: Diagnosis not present

## 2015-09-08 DIAGNOSIS — I209 Angina pectoris, unspecified: Secondary | ICD-10-CM | POA: Diagnosis not present

## 2015-09-08 DIAGNOSIS — R6884 Jaw pain: Secondary | ICD-10-CM

## 2015-09-08 DIAGNOSIS — I25119 Atherosclerotic heart disease of native coronary artery with unspecified angina pectoris: Secondary | ICD-10-CM | POA: Diagnosis not present

## 2015-09-08 DIAGNOSIS — Z789 Other specified health status: Secondary | ICD-10-CM

## 2015-09-08 DIAGNOSIS — Z889 Allergy status to unspecified drugs, medicaments and biological substances status: Secondary | ICD-10-CM | POA: Diagnosis not present

## 2015-09-08 DIAGNOSIS — E785 Hyperlipidemia, unspecified: Secondary | ICD-10-CM

## 2015-09-08 DIAGNOSIS — Z951 Presence of aortocoronary bypass graft: Secondary | ICD-10-CM

## 2015-09-08 DIAGNOSIS — E669 Obesity, unspecified: Secondary | ICD-10-CM | POA: Insufficient documentation

## 2015-09-08 MED ORDER — LISINOPRIL 20 MG PO TABS: 20.0000 mg | ORAL_TABLET | Freq: Every day | ORAL | Status: DC

## 2015-09-08 MED ORDER — METOPROLOL TARTRATE 50 MG PO TABS: 50.0000 mg | ORAL_TABLET | Freq: Two times a day (BID) | ORAL | Status: DC

## 2015-09-08 MED ORDER — NITROGLYCERIN 0.4 MG SL SUBL: 0.4000 mg | SUBLINGUAL_TABLET | SUBLINGUAL | Status: DC | PRN

## 2015-09-08 NOTE — Progress Notes (Signed)
Patient ID: Todd Collins, male    DOB: December 22, 1950, 64 y.o.   MRN: [id number]  HPI Comments: Todd Collins is a 64 year old gentleman , patient of Dr. Ola Spurr , with history of coronary artery disease, cardiac catheterization in March 2012 showing 50% mid LAD disease, 90% disease of a small diagonal, 50% proximal RCA disease, 70% mid RCA disease, 60-70% distal RCA disease, who represented several months later and was transferred to [facility] for a CABG x2 vessel, LIMA to the LAD, vein graft to the PDA 02/02/2011 who also reports having stent since then, who presents for routine followup of his coronary artery disease Notes indicate history of depression, He has significant statin intolerance  In follow-up today, he reports that he is not taking amlodipine. He reports he was not taking this last year when he was seen in clinic. We did have amlodipine on his list at that time. Blood pressure has been running high at home, typically [id number] systolic.  Recently was on trazodone and reports having significant swelling in his legs, as well as the rest of his body. Stop the trazodone and symptoms seem to get better  Recent pain in his jaw and teeth, had numerous teeth pulled. Still with jaw pain. Significant discomfort 3 days ago, took nitroglycerin. Previous anginal symptoms included jaw pain. This was discussed with him, but he is concerned it could be from one tooth that needs a root canal.  Reports that he is able to exert himself with no significant difficulty. "I can run as far and as fast as I want"  EKG on today's visit shows normal sinus rhythm with rate 68 bpm, no significant ST or T-wave changes  Other past medical history  previous episodes of jaw pain was from eating salty food, drinking alcohol. Water seemed to make his symptoms better. Symptoms not associated with exertion. 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 In the past reported ranexa  caused  side effects (uination issues)  Reports having a TIA in October 2014, blurry vision, were findings difficulty. CT scan of the head was normal. Echocardiogram and Holter performed. Holter showed normal sinus rhythm  Echocardiogram June 2012 showing ejection fraction 45% Echocardiogram October 2014 showing normal LV systolic function,  Stress test June 2012 showing mild inferior wall ischemia  Carotid ultrasound June 2012 showing no hemodynamically significant stenosis     Allergies  Allergen Reactions  . Omeprazole     Other reaction(s): Other (See Comments) Caused muscle cramps  . Prednisone     Elevated blood sugar   . Trazodone And Nefazodone     Shortness of breath  wheezing  . Wellbutrin [Bupropion]     Other reaction(s): RASH Upper extremity edema    Current Outpatient Prescriptions on File Prior to Visit  Medication Sig Dispense Refill  . allopurinol (ZYLOPRIM) 300 MG tablet Take 150 mg by mouth daily.    Marland Kitchen aspirin EC 81 MG tablet Take 81 mg by mouth daily.     . Omega-3 Fatty Acids (FISH OIL) 1000 MG CAPS Take by mouth 2 (two) times daily.     No current facility-administered medications on file prior to visit.    Past Medical History  Diagnosis Date  . Hyperlipidemia   . Hypertension   . Gout   . Depression   . CAD (coronary artery disease) 2012    Past Surgical History  Procedure Laterality Date  . Right index finger surgery    .  Coronary artery bypass graft  11/2010    [facility]  . Cardiac catheterization  12/25/2010  . Coronary angioplasty      stent x 1     Social History  reports that he quit smoking about 37 years ago. His smoking use included Cigarettes. He does not have any smokeless tobacco history on file. He reports that he drinks about 1.2 oz of alcohol per week. He reports that he does not use illicit drugs.  Family History family history includes Heart attack (age of onset: 82) in his father.   Review of Systems  Constitutional:  Negative.   HENT:       Periodic Jaw pain  Respiratory: Negative.   Cardiovascular: Negative.   Gastrointestinal: Negative.   Musculoskeletal: Negative.   Neurological: Negative.   Hematological: Negative.   Psychiatric/Behavioral: Negative.   All other systems reviewed and are negative.   BP 149/85 mmHg  Pulse 68  Ht 5' 8.5" (1.74 m)  Wt 219 lb 4 oz (99.451 kg)  BMI 32.85 kg/m2  Physical Exam  Constitutional: He is oriented to person, place, and time. He appears well-developed and well-nourished.  HENT:  Head: Normocephalic.  Nose: Nose normal.  Mouth/Throat: Oropharynx is clear and moist.  Eyes: Conjunctivae are normal. Pupils are equal, round, and reactive to light.  Neck: Normal range of motion. Neck supple. No JVD present.  Cardiovascular: Normal rate, regular rhythm, S1 normal, S2 normal and intact distal pulses.  Exam reveals no gallop and no friction rub.   Murmur heard.  Systolic murmur is present with a grade of 2/6  Pulmonary/Chest: Effort normal and breath sounds normal. No respiratory distress. He has no wheezes. He has no rales. He exhibits no tenderness.  Abdominal: Soft. Bowel sounds are normal. He exhibits no distension. There is no tenderness.  Musculoskeletal: Normal range of motion. He exhibits no edema or tenderness.  Lymphadenopathy:    He has no cervical adenopathy.  Neurological: He is alert and oriented to person, place, and time. Coordination normal.  Skin: Skin is warm and dry. No rash noted. No erythema.  Psychiatric: He has a normal mood and affect. His behavior is normal. Judgment and thought content normal.      Assessment and Plan   Nursing note and vitals reviewed.

## 2015-09-08 NOTE — Assessment & Plan Note (Signed)
He does not want to retry any of the cholesterol medications Cholesterol more than 250 Goal cholesterol 150 or less discussed with him He does not want Zetia. We did discuss newer agents such as praluent or repatha. He will call us if he is interested

## 2015-09-08 NOTE — Assessment & Plan Note (Signed)
Poorly controlled cholesterol. He is not interested in medical management at this time

## 2015-09-08 NOTE — Assessment & Plan Note (Signed)
Currently with jaw pain which was his previous anginal equivalent He feels it might be from his tooth that needs repair He does not want workup at this time and will call us if symptoms get worse and if he is requiring nitroglycerin

## 2015-09-08 NOTE — Assessment & Plan Note (Signed)
Coronary details as above. High risk of coronary progression given his hyperlipidemia

## 2015-09-08 NOTE — Patient Instructions (Signed)
You are doing well. No medication changes were made.  Goal blood pressure <140/<90 If it runs high, move the lisinopril to the morning Call if you need the 40 mg pill  Please call us if you have new issues that need to be addressed before your next appt.  Your physician wants you to follow-up in: 6 months.  You will receive a reminder letter in the mail two months in advance. If you don't receive a letter, please call our office to schedule the follow-up appointment.

## 2015-09-08 NOTE — Assessment & Plan Note (Signed)
We have encouraged continued exercise, careful diet management in an effort to lose weight. 

## 2015-09-08 NOTE — Assessment & Plan Note (Signed)
Previous anginal symptoms He is having jaw pain recently, taking nitroglycerin Does not want catheterization at this time

## 2015-09-08 NOTE — Assessment & Plan Note (Signed)
Known coronary artery disease, poorly controlled hyperlipidemia High risk of recurrent been progression of his disease Blood pressure elevated, seem to stop medications on his own He does not want cardiac catheterization at this time

## 2016-01-16 ENCOUNTER — Telehealth: Payer: Self-pay | Admitting: Cardiovascular Disease

## 2016-01-16 DIAGNOSIS — R079 Chest pain, unspecified: Secondary | ICD-10-CM

## 2016-01-16 NOTE — Telephone Encounter (Signed)
Spoke w/ pt.  He reports that he has some soreness in his chest and some discomfort in the top of his stomach. Sx have been present x 3 weeks.  Pt denies neck or back pain, which were present before he needed CABG.  He has not been active recently, was outside doing yard work when he developed SOBOE. He would like to know if he should come in for testing. Advised him that I am placing him on a waiting list in the event of a cancellation, but will make Dr. Rockey Situ aware and call him back w/ his recommendation. Pt understands to proceed to ED if his sx become emergent.

## 2016-01-16 NOTE — Telephone Encounter (Signed)
Patient is having pain in stomach (middle of chest ) patient also c/o sob on exertion   Pt c/o Shortness Of Breath: STAT if SOB developed within the last 24 hours or pt is noticeably SOB on the phone  1. Are you currently SOB (can you hear that pt is SOB on the phone)? No   2. How long have you been experiencing SOB? 2-3 weeks   3. Are you SOB when sitting or when up moving around?   Exertion getting worse   4. Are you currently experiencing any other symptoms? Stomach ( mid chest Pain like when had last cardiac event)

## 2016-01-16 NOTE — Telephone Encounter (Signed)
Would consider a stress myoview test to rule out a blockage

## 2016-01-17 NOTE — Telephone Encounter (Signed)
Spoke w/ pt.  He is agreeable to Dr. Donivan Scull recommendation.  Pt sched for ETT myoview 3/29 @ 7:30, understands to arrive @ the [place] @ 7:15, NPO after midnight, and hold metoprolol tonight and tomorrow am.

## 2016-01-18 ENCOUNTER — Ambulatory Visit
Admission: RE | Admit: 2016-01-18 | Discharge: 2016-01-18 | Disposition: A | Discharge status: Home / Self Care | Payer: BC Managed Care – PPO | Source: Ambulatory Visit | Attending: Cardiovascular Disease | Admitting: Cardiovascular Disease

## 2016-01-18 DIAGNOSIS — R9431 Abnormal electrocardiogram [ECG] [EKG]: Secondary | ICD-10-CM | POA: Diagnosis not present

## 2016-01-18 DIAGNOSIS — R079 Chest pain, unspecified: Secondary | ICD-10-CM | POA: Diagnosis present

## 2016-01-18 LAB — NM MYOCAR MULTI W/SPECT W/WALL MOTION / EF
CHL CUP NUCLEAR SDS: 0
CHL CUP NUCLEAR SRS: 0
CHL CUP NUCLEAR SSS: 0
CSEPEW: 8.9 METS
CSEPHR: 98 %
Exercise duration (min): 7 min
Exercise duration (sec): 15 s
LV sys vol: 13 mL
LVDIAVOL: 45 mL (ref 62–150)
Peak HR: 153 {beats}/min
Rest HR: 81 {beats}/min
TID: 1.27

## 2016-01-18 MED ORDER — TECHNETIUM TC 99M SESTAMIBI - CARDIOLITE
31.5500 | Freq: Once | INTRAVENOUS | Status: AC | PRN
  Administered 2016-01-18: 08:00:00 31.55 via INTRAVENOUS

## 2016-01-18 MED ORDER — TECHNETIUM TC 99M SESTAMIBI - CARDIOLITE
13.1830 | Freq: Once | INTRAVENOUS | Status: AC | PRN
  Administered 2016-01-18: 13.183 via INTRAVENOUS

## 2016-01-28 ENCOUNTER — Encounter: Payer: Self-pay | Admitting: Physician Assistant

## 2016-01-31 ENCOUNTER — Encounter: Payer: Self-pay | Admitting: Physician Assistant

## 2016-01-31 ENCOUNTER — Ambulatory Visit (INDEPENDENT_AMBULATORY_CARE_PROVIDER_SITE_OTHER): Payer: BC Managed Care – PPO | Admitting: Physician Assistant

## 2016-01-31 VITALS — BP 126/74 | HR 78 | Ht 68.5 in | Wt 222.5 lb

## 2016-01-31 DIAGNOSIS — T148 Other injury of unspecified body region: Secondary | ICD-10-CM

## 2016-01-31 DIAGNOSIS — R0602 Shortness of breath: Secondary | ICD-10-CM | POA: Diagnosis not present

## 2016-01-31 DIAGNOSIS — I2510 Atherosclerotic heart disease of native coronary artery without angina pectoris: Secondary | ICD-10-CM

## 2016-01-31 DIAGNOSIS — W57XXXA Bitten or stung by nonvenomous insect and other nonvenomous arthropods, initial encounter: Secondary | ICD-10-CM

## 2016-01-31 DIAGNOSIS — E785 Hyperlipidemia, unspecified: Secondary | ICD-10-CM

## 2016-01-31 NOTE — Patient Instructions (Signed)
Medication Instructions:  Please continue your current medications  Labwork: None  Testing/Procedures: None  Follow-Up: Your physician wants you to follow-up in: 6 months.  You will receive a reminder letter in the mail two months in advance.  If you don't receive a letter, please call our office to schedule the follow-up appointment.  If you need a refill on your cardiac medications before your next appointment, please call your pharmacy.   

## 2016-01-31 NOTE — Progress Notes (Signed)
Cardiology Office Note Date:  01/31/2016  Patient ID:  Todd Collins, Todd Collins 04-04-1951, MRN [id number] PCP:  Leonel Ramsay, MD  Cardiologist:  Dr. Rockey Situ, MD    Chief Complaint: 6 month and treadmill myoview follow up  History of Present Illness: Todd Collins is a 65 y.o. male with history of CAD s/p MI 12/22/2010 with subsequent 2 vessel CABG with LIMA to LAD and SVG to PDA in spring to early summer 2012 (some notes say March, some April, some June) s/p subsequent PCI/DES with Integrity Resolute 2.25 mm x 30 mm DES to commpletely occulded D1 on 03/23/2011, possible TIA, HTN, HLD with statin intolerance, GERD, gout, and steroid-induced hyperglycemia who presents for increased SOB.  Cardiac cath from March 2012 showed 50% mid LAD disease, 90% small diagonal disease (location unknown), 50% proximal RCA disease, 60-70% distal RCA disease. Echo from 01/18/2011 was essentially normal with EF of 60-65%. It is unclear how he was treated at the time of this cath. He ultimately underwent 2 vessel CABG in spring to early summer of 2012 as above. He had recurrent chest pain in May of 2012. Stress test from 03/12/11, exercised for 6 minutes, maximum heart rate 166, was 122% of target heart rate, 7.6 METS, had ST depressions of greater than 1 mm noted during the end of that study, but had no symptoms. Cardiac cath 03/23/2011 showed 20% lesion in the distal left main coronary artery, diffusely diseased mid LAD up to 75%, and 100% proximal occlusion of the first diagonal (s/p a 2.25 x 30 mm Integrity drug-eluting stent with 0% residual stenosis) and mildly diseased circumflex and 80% lesion in the mid RCA with a 95% lesion in the distal RCA. His LIMA to LAD was widely patent. His SVG to PDA was widely patent. Nuclear stress test 05/02/12 Impression: No evidence for significant ischemia or scar is noted. Global systolic function is normal. The EF is greater than 65%. Attenuation CT scan shows post cabg findings;  coronary calcification is noted in the LAD. At his last follow up with [facility] he was doing well, noting only occasional palptiations. He also noted some financial hardships and may have needed to discontinue Plavix. He reports a possible TIA in 06/2013. Head CT was normal. Echo and Holter was unremarkable. He last saw Dr. Rockey Situ on 09/08/2015 he noted some jaw pain s/p dental extraction. Of note, anginal equivalant is jaw pain for him. BP was elevated in the 123456 to 99991111 systolic.   He called the office on 01/16/2016 stating he had been more SOB for the prior 2-3 weeks with associated chest pain and epigastric pain (similar to prior MI). He was scheduled for nuclear stress test on 01/18/16 that showed horizontal ST segment depression during stress in leads II, III, aVF, V3-V6 concerning for ischemia at peak stress, EF 69%, no significant ischemia, low risk scan. Clinical correlation was advised given EKG changes. Patient comes in today for follow up of his chest pain.  Since undergoing his treadmill myoview he has not had any further SOB/chest pain. He he has been able to cut down and limb 4-5 full tress this past weekend (2 days ago) without any symptoms. He feels like he is at his baseline. He does report being bitten by 2 separate ticks prior to the above episode and also reports a history of Lyme disease. He denies any erythema migrans or rash along the distal extremities. He has been afebrile and without headache. He reports the ticks  were attached for 12-24 hours before being removed by himself. He has yet to seek medical attention from his PCP. He denies any orthopnea, PND, early satiety, or cough. No LE edema or abdominal distension. No jaw pain. He is tolerating his medications without issues. Not checking his BP at home.    Past Medical History  Diagnosis Date  . Hyperlipidemia   . Hypertension   . Gout   . Depression   . CAD (coronary artery disease) 2012    a. s/p 2 vessel CABG  (LIMA-LAD, SVG-PDA); b. cath 03/2011: LM 20%, mLAD 75%, pD1 100% s/p PCI/DES, mildly diseaed LCx, mRCA 80%, dRCA 95%, patent grafts  . Lyme disease     Past Surgical History  Procedure Laterality Date  . Right index finger surgery    . Cardiac catheterization  12/25/2010  . Coronary angioplasty      stent x 1   . Coronary artery bypass graft  11/2010    [facility]    Current Outpatient Prescriptions  Medication Sig Dispense Refill  . allopurinol (ZYLOPRIM) 300 MG tablet Take 150 mg by mouth daily.    Marland Kitchen aspirin EC 81 MG tablet Take 81 mg by mouth daily.     Marland Kitchen lisinopril (PRINIVIL,ZESTRIL) 20 MG tablet Take 1 tablet (20 mg total) by mouth daily. 90 tablet 3  . metoprolol (LOPRESSOR) 50 MG tablet Take 1 tablet (50 mg total) by mouth 2 (two) times daily. 180 tablet 3  . nitroGLYCERIN (NITROSTAT) 0.4 MG SL tablet Place 1 tablet (0.4 mg total) under the tongue every 5 (five) minutes as needed for chest pain. 25 tablet 6  . Omega-3 Fatty Acids (FISH OIL) 1000 MG CAPS Take by mouth 2 (two) times daily.     No current facility-administered medications for this visit.    Allergies:   Omeprazole; Prednisone; Trazodone and nefazodone; and Wellbutrin   Social History:  The patient  reports that he quit smoking about 38 years ago. His smoking use included Cigarettes. He does not have any smokeless tobacco history on file. He reports that he drinks about 1.2 oz of alcohol per week. He reports that he does not use illicit drugs.   Family History:  The patient's family history includes Heart attack (age of onset: 21) in his father.  ROS:  Review of Systems  Constitutional: Negative for fever, chills, weight loss, malaise/fatigue and diaphoresis.  HENT: Negative for congestion.   Eyes: Negative for discharge and redness.  Respiratory: Negative for cough, hemoptysis, sputum production, shortness of breath and wheezing.   Cardiovascular: Negative for chest pain, palpitations, orthopnea, claudication, leg  swelling and PND.  Gastrointestinal: Negative for heartburn, nausea, vomiting and abdominal pain.  Musculoskeletal: Negative for myalgias and falls.  Skin: Negative for rash.  Neurological: Negative for dizziness, tingling, tremors, sensory change, speech change, focal weakness, loss of consciousness and weakness.  Endo/Heme/Allergies: Does not bruise/bleed easily.  Psychiatric/Behavioral: Negative for substance abuse. The patient is not nervous/anxious.   All other systems reviewed and are negative.    PHYSICAL EXAM:  VS:  BP 126/74 mmHg  Pulse 78  Ht 5' 8.5" (1.74 m)  Wt 222 lb 8 oz (100.925 kg)  BMI 33.33 kg/m2 BMI: Body mass index is 33.33 kg/(m^2). Well nourished, well developed, in no acute distress HEENT: normocephalic, atraumatic Neck: no JVD, carotid bruits or masses Cardiac:  normal S1, S2; RRR; no murmurs, rubs, or gallops Lungs:  clear to auscultation bilaterally, no wheezing, rhonchi or rales Abd: soft, nontender, no  hepatomegaly, + BS MS: no deformity or atrophy Ext: no edema Skin: warm and dry, no rash Neuro:  moves all extremities spontaneously, no focal abnormalities noted, follows commands Psych: euthymic mood, full affect   EKG:  Was not ordered today 2/2 patient finances.   Recent Labs: No results found for requested labs within last 365 days.  No results found for requested labs within last 365 days.   CrCl cannot be calculated (Patient has no serum creatinine result on file.).   Wt Readings from Last 3 Encounters:  01/31/16 222 lb 8 oz (100.925 kg)  09/08/15 219 lb 4 oz (99.451 kg)  09/13/14 213 lb 8 oz (96.843 kg)     Other studies reviewed: Additional studies/records reviewed today include: summarized above  ASSESSMENT AND PLAN:  1. Increased SOB: Resolved. Able to limb multiple trees this weekend without any issues. He declines any further ischemic evaluation including R&LHC at this time and also declines echo to evaluate right-sided  pressures. Last echo from 2014 did show mildly elevated right-sided pressure at 38 mm Hg. This was explained to him. He reports if he redevelops symptoms he will notify us and proceed with R&LHC.  2. CAD s/p CABG as above: Treadmill Myoview as above. Continue aspirin 81 mg daily, Lopressor 50 mg bid, and SL NTG 0.4 mg prn. He has a statin intolerance. He declines further ischemic work up at this time given he is no longer symptomatic.   3. Recent tick bite: Advised patient to contact his PCP for tickborne illness titers and possible treatment if indicated per PCP and labs. It was also discussed with him the diagnosis of tickborne cardiomyopathy given his SOB, though timing does appear to be off and he is no longer symptomatic. No erythema migrans or distal rashes along the upper or lower extremities. Defer baseline labs including CBC, and CMET to PCP.   4. History of jaw pain: Resolved s/p dental work.   5. HLD with statin intolerance: On fish oil. Healthy diet and exercise advised.   Disposition: F/u with Dr. Rockey Situ, MD in 6 months.  Current medicines are reviewed at length with the patient today.  The patient did not have any concerns regarding medicines.  Melvern Banker PA-C 01/31/2016 3:19 PM     [address] [place], [place] [address] [phone]

## 2016-02-14 DIAGNOSIS — I25810 Atherosclerosis of coronary artery bypass graft(s) without angina pectoris: Secondary | ICD-10-CM | POA: Insufficient documentation

## 2016-07-30 ENCOUNTER — Other Ambulatory Visit: Payer: Self-pay | Admitting: Cardiovascular Disease

## 2016-08-14 DIAGNOSIS — M7551 Bursitis of right shoulder: Secondary | ICD-10-CM | POA: Insufficient documentation

## 2016-10-22 ENCOUNTER — Other Ambulatory Visit: Payer: Self-pay | Admitting: Cardiovascular Disease

## 2016-11-05 ENCOUNTER — Ambulatory Visit (INDEPENDENT_AMBULATORY_CARE_PROVIDER_SITE_OTHER): Payer: BC Managed Care – PPO | Admitting: Cardiovascular Disease

## 2016-11-05 ENCOUNTER — Encounter: Payer: Self-pay | Admitting: Cardiovascular Disease

## 2016-11-05 VITALS — BP 140/88 | HR 59 | Ht 68.5 in | Wt 218.5 lb

## 2016-11-05 DIAGNOSIS — Z789 Other specified health status: Secondary | ICD-10-CM | POA: Diagnosis not present

## 2016-11-05 DIAGNOSIS — I25709 Atherosclerosis of coronary artery bypass graft(s), unspecified, with unspecified angina pectoris: Secondary | ICD-10-CM

## 2016-11-05 DIAGNOSIS — Z87891 Personal history of nicotine dependence: Secondary | ICD-10-CM

## 2016-11-05 DIAGNOSIS — Z951 Presence of aortocoronary bypass graft: Secondary | ICD-10-CM

## 2016-11-05 DIAGNOSIS — I209 Angina pectoris, unspecified: Secondary | ICD-10-CM | POA: Diagnosis not present

## 2016-11-05 DIAGNOSIS — E782 Mixed hyperlipidemia: Secondary | ICD-10-CM

## 2016-11-05 DIAGNOSIS — I10 Essential (primary) hypertension: Secondary | ICD-10-CM | POA: Insufficient documentation

## 2016-11-05 MED ORDER — LISINOPRIL 20 MG PO TABS: 20.0000 mg | ORAL_TABLET | Freq: Every day | ORAL | 3 refills | Status: DC

## 2016-11-05 MED ORDER — METOPROLOL TARTRATE 50 MG PO TABS: 50.0000 mg | ORAL_TABLET | Freq: Two times a day (BID) | ORAL | 3 refills | Status: DC

## 2016-11-05 MED ORDER — NITROGLYCERIN 0.4 MG SL SUBL: 0.4000 mg | SUBLINGUAL_TABLET | SUBLINGUAL | 6 refills | Status: DC | PRN

## 2016-11-05 NOTE — Progress Notes (Signed)
Cardiology Office Note  Date:  11/05/2016   ID:  Todd Collins, DOB 1950-12-06, MRN [id number]  PCP:  Todd Ramsay, MD   Chief Complaint  Patient presents with  . other    6 month follow up. Meds reviewed by the pt. verbally. "doing well."     HPI:   Todd Collins is a 66 year old gentleman , patient of Todd Collins , with history of coronary artery disease, cardiac catheterization in March 2012 showing 50% mid LAD disease, 90% disease of a small diagonal, 50% proximal RCA disease, 70% mid RCA disease, 60-70% distal RCA disease, who represented several months later and was transferred to [facility] for a CABG x2 vessel, LIMA to the LAD, vein graft to the PDA 02/02/2011 who also reports having stent since then, who presents for routine followup of his coronary artery disease Notes indicate history of depression, He has significant statin intolerance  BP cuff at home, has not been using it, No exercise ocassional gurgle, rare. Laying down Shovels at work, no CP No jaw pain, Does not want statin He reports blood pressure is elevated, he would like to use extra lisinopril he has laying around the house but does not want a higher dose. He does not want other medications for blood pressure  echo 2014, murmur  EKG on today's visit shows normal sinus rhythm with rate 59 bpm, no significant ST or T-wave changes  Other past medical history  previous episodes of jaw pain was from eating salty food, drinking alcohol. Water seemed to make his symptoms better. Symptoms not associated with exertion. 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 In the past reported ranexa  caused side effects (uination issues)  Reports having a TIA in October 2014, blurry vision, were findings difficulty. CT scan of the head was normal. Echocardiogram and Holter performed. Holter showed normal sinus rhythm  Echocardiogram June 2012 showing ejection fraction  45% Echocardiogram October 2014 showing normal LV systolic function,  Stress test June 2012 showing mild inferior wall ischemia  Carotid ultrasound June 2012 showing no hemodynamically significant stenosis    PMH:   has a past medical history of CAD (coronary artery disease) (2012); Depression; Gout; Hyperlipidemia; Hypertension; and Lyme disease.  PSH:    Past Surgical History:  Procedure Laterality Date  . CARDIAC CATHETERIZATION  12/25/2010  . CORONARY ANGIOPLASTY     stent x 1   . CORONARY ARTERY BYPASS GRAFT  11/2010   [facility]  . right index finger surgery      Current Outpatient Prescriptions  Medication Sig Dispense Refill  . allopurinol (ZYLOPRIM) 300 MG tablet Take 150 mg by mouth daily.    Marland Kitchen aspirin EC 81 MG tablet Take 81 mg by mouth daily.     Marland Kitchen lisinopril (PRINIVIL,ZESTRIL) 20 MG tablet Take 1 tablet (20 mg total) by mouth daily. 90 tablet 3  . metoprolol (LOPRESSOR) 50 MG tablet Take 1 tablet (50 mg total) by mouth 2 (two) times daily. 180 tablet 3  . nitroGLYCERIN (NITROSTAT) 0.4 MG SL tablet Place 1 tablet (0.4 mg total) under the tongue every 5 (five) minutes as needed for chest pain. 25 tablet 6  . Omega-3 Fatty Acids (FISH OIL) 1000 MG CAPS Take by mouth 2 (two) times daily.     No current facility-administered medications for this visit.      Allergies:   Omeprazole; Prednisone; Trazodone and nefazodone; and Wellbutrin [bupropion]   Social History:  The patient  reports that he quit smoking about 38 years ago. His smoking use included Cigarettes. He has never used smokeless tobacco. He reports that he drinks about 1.2 oz of alcohol per week . He reports that he does not use drugs.   Family History:   family history includes Heart attack (age of onset: 33) in his father.    Review of Systems: Review of Systems  Constitutional: Negative.   Respiratory: Negative.   Cardiovascular: Negative.   Gastrointestinal: Negative.   Musculoskeletal: Negative.    Neurological: Negative.   Psychiatric/Behavioral: Negative.   All other systems reviewed and are negative.    PHYSICAL EXAM: VS:  BP 140/88 (BP Location: Left Arm, Patient Position: Sitting, Cuff Size: Normal)   Pulse (!) 59   Ht 5' 8.5" (1.74 m)   Wt 218 lb 8 oz (99.1 kg)   BMI 32.74 kg/m  , BMI Body mass index is 32.74 kg/m. GEN: Well nourished, well developed, in no acute distress  HEENT: normal  Neck: no JVD, carotid bruits, or masses Cardiac: RRR; no murmurs, rubs, or gallops,no edema  Respiratory:  clear to auscultation bilaterally, normal work of breathing GI: soft, nontender, nondistended, + BS MS: no deformity or atrophy  Skin: warm and dry, no rash Neuro:  Strength and sensation are intact Psych: euthymic mood, full affect    Recent Labs: No results found for requested labs within last 8760 hours.    Lipid Panel No results found for: CHOL, HDL, LDLCALC, TRIG    Wt Readings from Last 3 Encounters:  11/05/16 218 lb 8 oz (99.1 kg)  01/31/16 222 lb 8 oz (100.9 kg)  09/08/15 219 lb 4 oz (99.5 kg)       ASSESSMENT AND PLAN:  Angina pectoris (HCC) Denies any significant chest pain, jaw pain with exertion Some atypical symptoms at rest No further testing at this time  Coronary artery disease involving coronary bypass graft with angina pectoris, unspecified whether native or transplanted heart ([place]) - Plan: EKG 12-Lead As above, patient denies any symptoms concerning for angina, EKG unchanged on my review today  Mixed hyperlipidemia He does not want medications for cholesterol, as on previous visit he does not want statin, Zetia He will read about other new medication options such as repatha  Statin intolerance As above reports having statin intolerance to all medications  S/P CABG x 2 Previous surgical procedures discussed with him  Prior smoking history Stopped many years ago  Essential hypertension He would like to use extra lisinopril he has  laying around the house, does not want a prescription for bigger dose and does not want other medications   Total encounter time more than 15 minutes  Greater than 50% was spent in counseling and coordination of care with the patient   Disposition:   F/U  6 months   Orders Placed This Encounter  Procedures  . EKG 12-Lead     Signed, Esmond Plants, M.D., Ph.D. 11/05/2016  [place], [place]

## 2016-11-05 NOTE — Patient Instructions (Addendum)
Medication Instructions:   Please monitor BP If elevated, We could increase lisinopril  Research REPATHA, PRALUENT for cholesterol   Labwork:  No new labs needed  Testing/Procedures:  No further testing at this time   I recommend watching educational videos on topics of interest to you at:       [email or web address].[email or web address]  Enter code: HEARTCARE    Follow-Up: It was a pleasure seeing you in the office today. Please call us if you have new issues that need to be addressed before your next appt.  [phone]  Your physician wants you to follow-up in: 6 months.  You will receive a reminder letter in the mail two months in advance. If you don't receive a letter, please call our office to schedule the follow-up appointment.  If you need a refill on your cardiac medications before your next appointment, please call your pharmacy.

## 2016-11-19 ENCOUNTER — Telehealth: Payer: Self-pay | Admitting: Cardiovascular Disease

## 2016-11-19 NOTE — Telephone Encounter (Signed)
40 mg would be the maximum We can send in a new prescription for lisinopril needed  Would monitor blood pressure at home Make sure his blood pressure cuff is working

## 2016-11-19 NOTE — Telephone Encounter (Signed)
Pt was seen in the office 11/05/16: He was advised:  Please monitor BP If elevated, We could increase lisinopril  He reports that his BP has consistently been high: today 194/91.  He is currently on lisinopril 20 mg once daily. What dose would you like to increase him to?

## 2016-11-19 NOTE — Telephone Encounter (Signed)
Pt calling asking if we can up his dose on Lisinopril He increase it to 30 mg today  He would like [place] to re look at the dose for  his BP seems to be staying high Just a little bit ago it was 194/91  Please advise.

## 2016-11-20 NOTE — Telephone Encounter (Signed)
Spoke w/ pt.  He reports that he has been taking lisinopril 20 mg, but when he increased to 40 mg, his BP went up.  He states that his lisinopril actually increases his BP instead of lowering it.  Attempted to discuss this w/ pt, but he is adamant that the lisinopril is sending his BP up.  He reports that his BP today was 167/78, so he only took 20 mg today. He states that he would like to try 1/2 20 mg pill to see if this will bring his BP down.  Advised pt that lisinopril will bring his BP down, not send it up, and that increasing the dose will lower his BP. He insists that it has the opposite effect on him and he would like to try the 1/2 pill dosing for a few days. Asked pt to record his readings and call me in a few days and update me. He is appreciative of the call.

## 2016-11-23 ENCOUNTER — Telehealth: Payer: Self-pay | Admitting: Cardiovascular Disease

## 2016-11-23 ENCOUNTER — Ambulatory Visit (INDEPENDENT_AMBULATORY_CARE_PROVIDER_SITE_OTHER): Payer: BC Managed Care – PPO | Admitting: Cardiovascular Disease

## 2016-11-23 ENCOUNTER — Encounter: Payer: Self-pay | Admitting: Cardiovascular Disease

## 2016-11-23 VITALS — BP 162/80 | HR 54 | Ht 68.5 in | Wt 218.8 lb

## 2016-11-23 DIAGNOSIS — I10 Essential (primary) hypertension: Secondary | ICD-10-CM | POA: Diagnosis not present

## 2016-11-23 DIAGNOSIS — I25709 Atherosclerosis of coronary artery bypass graft(s), unspecified, with unspecified angina pectoris: Secondary | ICD-10-CM

## 2016-11-23 DIAGNOSIS — E782 Mixed hyperlipidemia: Secondary | ICD-10-CM

## 2016-11-23 DIAGNOSIS — Z951 Presence of aortocoronary bypass graft: Secondary | ICD-10-CM

## 2016-11-23 MED ORDER — AMLODIPINE BESYLATE 10 MG PO TABS: 10.0000 mg | ORAL_TABLET | Freq: Every day | ORAL | 11 refills | Status: DC

## 2016-11-23 NOTE — Telephone Encounter (Signed)
Spoke w/ pt.  Per previous phone note, pt feels that increased lisinopril actually increases his BP. He reports that his BP has not gone down lower than 167/78 when he took 20 mg. He took 20 mg last night, this am BP is 197/95.   His BP on 10 mg was 175/90 and on 40 mg 197/97. Pt has been adjusting his dose on a daily basis, has not taken the same dose for more than 3 days in a row. Pt is not willing to remain on lisinopril. He would like to come by the office to "prove" that the med is having the opposite effect on him. Advised him that Dr. Rockey Situ as an opening at 11:20 this am - we would like this appt, as he has a root canal today and 9:00 across the road and can come straight over afterwards.

## 2016-11-23 NOTE — Patient Instructions (Addendum)
Medication Instructions:   Please start amlodipine once a day Stay on metoprolol twice a day Stay on lisinopril  Labwork:  No new labs needed  Testing/Procedures:  No further testing at this time   I recommend watching educational videos on topics of interest to you at:       [email or web address].[email or web address]  Enter code: HEARTCARE    Follow-Up: It was a pleasure seeing you in the office today. Please call us if you have new issues that need to be addressed before your next appt.  [phone]  Your physician wants you to follow-up in: 6 months.  You will receive a reminder letter in the mail two months in advance. If you don't receive a letter, please call our office to schedule the follow-up appointment.  If you need a refill on your cardiac medications before your next appointment, please call your pharmacy.

## 2016-11-23 NOTE — Progress Notes (Signed)
Cardiology Office Note  Date:  11/23/2016   ID:  Todd Collins, DOB 1950-11-23, MRN [id number]  PCP:  Leonel Ramsay, MD   Chief Complaint  Patient presents with  . other     Early f/u due to pt call stating BP increasing on lisinopril. Pt c/o face being more red than usual. Reviewed meds with pt verbally.    HPI:  Todd Collins is a 66 year old gentleman , patient of Dr. Ola Spurr , with history of coronary artery disease, cardiac catheterization in March 2012 showing 50% mid LAD disease, 90% disease of a small diagonal, 50% proximal RCA disease, 70% mid RCA disease, 60-70% distal RCA disease, who represented several months later and was transferred to [facility] for a CABG x2 vessel, LIMA to the LAD, vein graft to the PDA 02/02/2011 who also reports having stent since then, who presents for routine followup of his coronary artery disease and hypertension Notes indicate history of depression, He has significant statin intolerance  He was seen recently in clinic but reported his blood pressure is not staying down He tried to increase the lisinopril up to 30 mg even 40 mg with no improvement of his blood pressure He is back to taking lisinopril 20 mg daily with metoprolol 50 mg twice a day  Reports having a TIA yesterday, he had vision issue  "squiggly in both eyes", "like a kalidescope" Lasted 1 hour No further episodes, back to normal now, no other neurologic issues  He does monitor blood pressure closely at home recently Active at work with no significant chest pain Does not want statin  Other past medical history previous episodes of jaw pain was from eating salty food, drinking alcohol. Water seemed to make his symptoms better. Symptoms not associated with exertion. 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 In the past reported ranexa caused side effects (uination issues)  Reports having a TIA in October 2014, blurry vision, were findings  difficulty. CT scan of the head was normal. Echocardiogram and Holter performed. Holter showed normal sinus rhythm  Echocardiogram June 2012 showing ejection fraction 45% Echocardiogram October 2014 showing normal LV systolic function,  Stress test June 2012 showing mild inferior wall ischemia  Carotid ultrasound June 2012 showing no hemodynamically significant stenosis   PMH:   has a past medical history of CAD (coronary artery disease) (2012); Depression; Gout; Hyperlipidemia; Hypertension; and Lyme disease.  PSH:    Past Surgical History:  Procedure Laterality Date  . CARDIAC CATHETERIZATION  12/25/2010  . CORONARY ANGIOPLASTY     stent x 1   . CORONARY ARTERY BYPASS GRAFT  11/2010   [facility]  . right index finger surgery      Current Outpatient Prescriptions  Medication Sig Dispense Refill  . allopurinol (ZYLOPRIM) 300 MG tablet Take 150 mg by mouth daily.    Marland Kitchen aspirin EC 81 MG tablet Take 81 mg by mouth daily.     Marland Kitchen lisinopril (PRINIVIL,ZESTRIL) 20 MG tablet Take 1 tablet (20 mg total) by mouth daily. 90 tablet 3  . metoprolol (LOPRESSOR) 50 MG tablet Take 1 tablet (50 mg total) by mouth 2 (two) times daily. 180 tablet 3  . nitroGLYCERIN (NITROSTAT) 0.4 MG SL tablet Place 1 tablet (0.4 mg total) under the tongue every 5 (five) minutes as needed for chest pain. 25 tablet 6  . Omega-3 Fatty Acids (FISH OIL) 1000 MG CAPS Take by mouth 2 (two) times daily.     No  current facility-administered medications for this visit.      Allergies:   Omeprazole; Prednisone; Trazodone and nefazodone; and Wellbutrin [bupropion]   Social History:  The patient  reports that he quit smoking about 39 years ago. His smoking use included Cigarettes. He has never used smokeless tobacco. He reports that he drinks about 1.2 oz of alcohol per week . He reports that he does not use drugs.   Family History:   family history includes Heart attack (age of onset: 74) in his father.    Review of  Systems: Review of Systems  Constitutional: Negative.   Respiratory: Negative.   Cardiovascular: Negative.   Gastrointestinal: Negative.   Musculoskeletal: Negative.   Neurological: Negative.   Psychiatric/Behavioral: Negative.   All other systems reviewed and are negative.    PHYSICAL EXAM: VS:  BP (!) 162/80 (BP Location: Left Arm, Patient Position: Sitting, Cuff Size: Normal)   Pulse (!) 54   Ht 5' 8.5" (1.74 m)   Wt 218 lb 12 oz (99.2 kg)   BMI 32.78 kg/m  , BMI Body mass index is 32.78 kg/m. GEN: Well nourished, well developed, in no acute distress  HEENT: normal  Neck: no JVD, carotid bruits, or masses Cardiac: RRR; no murmurs, rubs, or gallops,no edema  Respiratory:  clear to auscultation bilaterally, normal work of breathing GI: soft, nontender, nondistended, + BS MS: no deformity or atrophy  Skin: warm and dry, no rash Neuro:  Strength and sensation are intact Psych: euthymic mood, full affect    Recent Labs: No results found for requested labs within last 8760 hours.    Lipid Panel No results found for: CHOL, HDL, LDLCALC, TRIG    Wt Readings from Last 3 Encounters:  11/23/16 218 lb 12 oz (99.2 kg)  11/05/16 218 lb 8 oz (99.1 kg)  01/31/16 222 lb 8 oz (100.9 kg)       ASSESSMENT AND PLAN:  Coronary artery disease involving coronary bypass graft with angina pectoris, unspecified whether native or transplanted heart ([place])  Mixed hyperlipidemia He does not want a statin at this time, will talk about it in future visits  Essential hypertension We will start amlodipine 10 mg daily Recommended he stay on lisinopril 20 mg daily, metoprolol 50 mill grams twice a day Other options include imdur, losartan  S/P CABG x 2 Denies having any anginal symptoms   Total encounter time more than 15 minutes  Greater than 50% was spent in counseling and coordination of care with the patient   Disposition:   F/U  6 months  No orders of the defined types were  placed in this encounter.    Signed, Esmond Plants, M.D., Ph.D. 11/23/2016  [place], [place]

## 2016-11-23 NOTE — Telephone Encounter (Signed)
Pt calling stating his lisinopril is not working to control his BP anymore  Would like to try something else Please advise

## 2017-02-27 ENCOUNTER — Telehealth: Payer: Self-pay | Admitting: Cardiovascular Disease

## 2017-02-27 NOTE — Telephone Encounter (Signed)
Spoke w/ pt.  He reports that he previously took amlodipine w/o any issue, but this time he is not able to tolerate.  He reports that his ankles starting swelling last week and that now he is starting to swell all over.  He reports his BP has been around 120/80s. He has already taken amlodipine today. Advised him not to take any more until I call him back w/ Dr. Donivan Scull recommendation.  He is appreciative and will await a call back.

## 2017-02-27 NOTE — Telephone Encounter (Signed)
Pt would like a call regarding changing his medication, Amlodipine. He states he ankles and swelling and his finger and waist are swelling, states his clothes are not fitting anymore. Please call.

## 2017-02-28 NOTE — Telephone Encounter (Signed)
Left voicemail message to call back  

## 2017-02-28 NOTE — Telephone Encounter (Signed)
Would stop the amlodipine given his leg swelling Would increase lisinopril up to 40 mg daily Would also start HCTZ 25 mg daily Minimize fluid and salt intake

## 2017-03-01 MED ORDER — LISINOPRIL 40 MG PO TABS: 40.0000 mg | ORAL_TABLET | Freq: Every day | ORAL | 3 refills | Status: DC

## 2017-03-01 MED ORDER — HYDROCHLOROTHIAZIDE 25 MG PO TABS: 25.0000 mg | ORAL_TABLET | Freq: Every day | ORAL | 3 refills | Status: DC

## 2017-03-01 NOTE — Telephone Encounter (Signed)
Spoke w/ pt.  Advised him of Dr. Donivan Scull recommendation.  He reports that the swelling in his feet has improved since holding amlodipine, but his abdomen is still swollen.  Advised him that he may hold HCTZ when swelling has resolved and use prn.  He states that he has difficulty restricting his fluid 2/2 gout and allopurinol. Asked him to call back if his sx do not improve.

## 2017-03-01 NOTE — Telephone Encounter (Signed)
Pt retuning our call  °

## 2017-03-01 NOTE — Telephone Encounter (Signed)
Left message for pt to call back  °

## 2017-06-02 NOTE — Progress Notes (Signed)
Cardiology Office Note  Date:  06/03/2017   ID:  Todd Collins, DOB 1951-09-18, MRN [id number]  PCP:  Leonel Ramsay, MD   Chief Complaint  Patient presents with  . other    6 month follow up. Meds reviewed by the pt. verbally. Pt. c/o shortness of breath with little activity.     HPI:  Todd Collins is a 66 year old gentleman  with history of  coronary artery disease,  catheterization in March 2012 showing 50% mid LAD disease, 90% disease of a small diagonal, 50% proximal RCA disease, 70% mid RCA disease, 60-70% distal RCA disease, represented several months later  CABG x2 vessel, LIMA to the LAD, vein graft to the PDA 02/02/2011  stent  depression, significant statin intolerance who presents for routine followup of his coronary artery disease and hypertension  In follow-up today he reports blood pressures well controlled Dispense week at the beach Denies any chest pain on exertion Reports having to stop periodically to rest, has shortness of breath 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 Water seemed to make his symptoms better. Symptoms not associated with exertion. 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 In the past reported ranexa caused side effects (uination issues)  Reports having a TIA in October 2014, blurry vision, were findings difficulty. CT scan of the head was normal. Echocardiogram and Holter performed. Holter showed normal sinus rhythm  Echocardiogram June 2012  showing ejection fraction 45% Echocardiogram October 2014 showing normal LV systolic function,  Stress test June 2012 showing mild inferior wall ischemia  Carotid ultrasound June 2012 showing no hemodynamically significant stenosis   PMH:   has a past medical history of CAD (coronary artery disease) (2012); Depression; Gout; Hyperlipidemia; Hypertension; and Lyme disease.  PSH:    Past Surgical History:  Procedure Laterality Date  . CARDIAC CATHETERIZATION  12/25/2010  . CORONARY ANGIOPLASTY     stent x 1   . CORONARY ARTERY BYPASS GRAFT  11/2010   [facility]  . right index finger surgery      Current Outpatient Prescriptions  Medication Sig Dispense Refill  . allopurinol (ZYLOPRIM) 300 MG tablet Take 150 mg by mouth daily.    Marland Kitchen aspirin EC 81 MG tablet Take 81 mg by mouth daily.     Marland Kitchen lisinopril (PRINIVIL,ZESTRIL) 40 MG tablet Take 1 tablet (40 mg total) by mouth daily. 90 tablet 3  . metoprolol (LOPRESSOR) 50 MG tablet Take 1 tablet (50 mg total) by mouth 2 (two) times daily. 180 tablet 3  . nitroGLYCERIN (NITROSTAT) 0.4 MG SL tablet Place 1 tablet (0.4 mg total) under the tongue every 5 (five) minutes as needed for chest pain. 25 tablet 6  . Omega-3 Fatty Acids (FISH OIL) 1000 MG CAPS Take by mouth 2 (two) times daily.     No current facility-administered medications for this visit.      Allergies:   Omeprazole; Prednisone; Trazodone and nefazodone; and Wellbutrin [bupropion]   Social History:  The patient  reports that he quit smoking about 39 years  ago. His smoking use included Cigarettes. He has never used smokeless tobacco. He reports that he drinks about 1.2 oz of alcohol per week . He reports that he does not use drugs.   Family History:   family history includes Heart attack (age of onset: 66) in his father.    Review of Systems: Review of Systems  Constitutional: Negative.   Respiratory: Negative.   Cardiovascular: Negative.   Gastrointestinal: Negative.    Musculoskeletal: Negative.   Neurological: Negative.   Psychiatric/Behavioral: Negative.   All other systems reviewed and are negative.    PHYSICAL EXAM: VS:  BP 124/80 (BP Location: Left Arm, Patient Position: Sitting, Cuff Size: Normal)   Pulse 65   Ht 5' 8.5" (1.74 m)   Wt 225 lb 4 oz (102.2 kg)   BMI 33.75 kg/m  , BMI Body mass index is 33.75 kg/m. GEN: Well nourished, well developed, in no acute distress  HEENT: normal  Neck: no JVD, carotid bruits, or masses Cardiac: RRR; no murmurs, rubs, or gallops,no edema  Respiratory:  clear to auscultation bilaterally, normal work of breathing GI: soft, nontender, nondistended, + BS MS: no deformity or atrophy  Skin: warm and dry, no rash Neuro:  Strength and sensation are intact Psych: euthymic mood, full affect    Recent Labs: No results found for requested labs within last 8760 hours.    Lipid Panel No results found for: CHOL, HDL, LDLCALC, TRIG    Wt Readings from Last 3 Encounters:  06/03/17 225 lb 4 oz (102.2 kg)  11/23/16 218 lb 12 oz (99.2 kg)  11/05/16 218 lb 8 oz (99.1 kg)       ASSESSMENT AND PLAN:  Coronary artery disease involving coronary bypass graft with angina pectoris, unspecified whether native or transplanted heart Todd Collins) Currently with no symptoms of angina. No further workup at this time. Continue current medication regimen.  Mixed hyperlipidemia He does not want a statin at this time,  Talked about other agents repatha and not interested at this time praluent.   Essential hypertension lisinopril 40 mg daily, metoprolol 50 mgrams twice a day Blood pressure well controlled   S/P CABG x 2 Denies having any anginal symptoms   shortness of breath Likely deconditioning, presents on exertion, less likely angina Requesting disability placard Weight also up 10 pounds from over eating We have encouraged continued exercise, careful diet management in an effort to lose weight.    Total  encounter time more than 25 minutes  Greater than 50% was spent in counseling and coordination of care with the patient   Disposition:   F/U  6 months   Orders Placed This Encounter  Procedures  . EKG 12-Lead     Signed, Esmond Plants, M.D., Ph.D. 06/03/2017  [place], [place]

## 2017-06-03 ENCOUNTER — Ambulatory Visit (INDEPENDENT_AMBULATORY_CARE_PROVIDER_SITE_OTHER): Payer: BC Managed Care – PPO | Admitting: Cardiovascular Disease

## 2017-06-03 ENCOUNTER — Encounter: Payer: Self-pay | Admitting: Cardiovascular Disease

## 2017-06-03 VITALS — BP 124/80 | HR 65 | Ht 68.5 in | Wt 225.2 lb

## 2017-06-03 DIAGNOSIS — Z951 Presence of aortocoronary bypass graft: Secondary | ICD-10-CM | POA: Diagnosis not present

## 2017-06-03 DIAGNOSIS — E782 Mixed hyperlipidemia: Secondary | ICD-10-CM | POA: Diagnosis not present

## 2017-06-03 DIAGNOSIS — Z955 Presence of coronary angioplasty implant and graft: Secondary | ICD-10-CM

## 2017-06-03 DIAGNOSIS — I25709 Atherosclerosis of coronary artery bypass graft(s), unspecified, with unspecified angina pectoris: Secondary | ICD-10-CM

## 2017-06-03 DIAGNOSIS — I10 Essential (primary) hypertension: Secondary | ICD-10-CM | POA: Diagnosis not present

## 2017-06-03 DIAGNOSIS — Z87891 Personal history of nicotine dependence: Secondary | ICD-10-CM

## 2017-06-03 NOTE — Patient Instructions (Addendum)

## 2017-11-18 ENCOUNTER — Other Ambulatory Visit: Payer: Self-pay

## 2017-11-18 ENCOUNTER — Telehealth: Payer: Self-pay | Admitting: Cardiovascular Disease

## 2017-11-18 MED ORDER — METOPROLOL TARTRATE 50 MG PO TABS: 50.0000 mg | ORAL_TABLET | Freq: Two times a day (BID) | ORAL | 3 refills | Status: DC

## 2017-11-18 NOTE — Telephone Encounter (Signed)
Requested Prescriptions   Signed Prescriptions Disp Refills  . metoprolol tartrate (LOPRESSOR) 50 MG tablet 180 tablet 3    Sig: Take 1 tablet (50 mg total) by mouth 2 (two) times daily.    Authorizing Provider: Minna Merritts    Ordering User: Janan Ridge

## 2017-11-18 NOTE — Telephone Encounter (Signed)
°*  STAT* If patient is at the pharmacy, call can be transferred to refill team.   1. Which medications need to be refilled? (please list name of each medication and dose if known) metoprolol   2. Which pharmacy/location (including street and city if local pharmacy) is medication to be sent to? [facility] on [address] in [place]    3. Do they need a 30 day or 90 day supply? 90 day

## 2018-02-09 ENCOUNTER — Other Ambulatory Visit: Payer: Self-pay | Admitting: Cardiovascular Disease

## 2018-05-11 ENCOUNTER — Other Ambulatory Visit: Payer: Self-pay | Admitting: Cardiovascular Disease

## 2018-06-19 ENCOUNTER — Other Ambulatory Visit: Payer: Self-pay | Admitting: Family Medicine

## 2018-06-19 DIAGNOSIS — M79661 Pain in right lower leg: Secondary | ICD-10-CM

## 2018-06-20 ENCOUNTER — Ambulatory Visit
Admission: RE | Admit: 2018-06-20 | Discharge: 2018-06-20 | Disposition: A | Discharge status: Home / Self Care | Payer: BC Managed Care – PPO | Source: Ambulatory Visit | Attending: Family Medicine | Admitting: Family Medicine

## 2018-06-20 DIAGNOSIS — M79661 Pain in right lower leg: Secondary | ICD-10-CM | POA: Insufficient documentation

## 2018-08-17 ENCOUNTER — Other Ambulatory Visit: Payer: Self-pay | Admitting: Cardiovascular Disease

## 2018-11-14 ENCOUNTER — Other Ambulatory Visit: Payer: Self-pay | Admitting: Cardiovascular Disease

## 2018-11-30 ENCOUNTER — Other Ambulatory Visit: Payer: Self-pay | Admitting: Cardiovascular Disease

## 2018-12-04 ENCOUNTER — Other Ambulatory Visit: Payer: Self-pay

## 2018-12-04 MED ORDER — METOPROLOL TARTRATE 50 MG PO TABS: ORAL_TABLET | ORAL | 0 refills | Status: DC

## 2018-12-04 NOTE — Telephone Encounter (Signed)
Requested Prescriptions   Signed Prescriptions Disp Refills  . metoprolol tartrate (LOPRESSOR) 50 MG tablet 30 tablet 0    Sig: TAKE 1 TABLET(50 MG) BY MOUTH TWICE DAILY    Authorizing Provider: Minna Merritts    Ordering User: Janan Ridge

## 2018-12-08 NOTE — Progress Notes (Signed)
Cardiology Office Note Date:  12/09/2018  Patient ID:  Todd, Collins 09-May-1951, MRN [id number] PCP:  Leonel Ramsay, MD  Cardiologist:  Dr. Rockey Situ, MD    Chief Complaint: Follow-up  History of Present Illness: Todd Collins is a 68 y.o. male with history of CAD with MI in 2012 with subsequent two-vessel CABG with LIMA to LAD and SVG to PDA in 2012 status post subsequent PCI/DES to an occluded D1 in 03/2011, possible TIA, HTN, HLD with statin intolerance, GERD, gout, and steroid-induced hyperglycemia who presents for follow-up of his CAD.  He was previously followed by [facility].  Carotid artery ultrasound from 2012 showed no hemodynamically significant stenoses.  Cath from 12/2010 showed 50% mid LAD disease, 90% small diagonal disease, 50% proximal RCA disease, 60-70% distal RCA disease. Echo from 01/18/2011 was essentially normal with EF of 60-65%.  Following this, he underwent two-vessel CABG in 2012.  In the spring 2012 he had recurrent chest pain with stress test from 02/2011 showing he exercised for 6 minutes, maximum heart rate 166, was 122% of target heart rate, 7.6 METs, had ST depressions of greater than 1 mm noted during the end of that study, but had no symptoms.  Follow-up cardiac cath in 03/2011 showed 20% lesion in the distal left main, diffusely diseased mid LAD up to 75%, and 100% proximal occlusion of the first diagonal with subsequent PCI/DES, mildly diseased LCx and 80% stenosis in the mid RCA with a 95% stenosis in the distal RCA.  His LIMA to LAD and SVG to PDA were widely patent.  Repeat stress test in 2013 showed no evidence of ischemia or scar with EF > 65%.  Prior TIA-like symptoms in 06/2013 with head CT being normal.  At that time, echo and Holter monitoring were unremarkable.  Most recent ischemic evaluation via nuclear stress test in 12/2015 showed no significant ischemia with 1 mm ST depression in leads V5 and V6 concerning for ischemia at peak stress.  EF  69%.  Overall, this was read as a low risk scan.  He was most recently seen in the office in 05/2017 for follow-up and noted he had to stop periodically to rest secondary to shortness of breath, which was felt to be in the setting of deconditioning and less likely angina.  His weight was up approximately 10 pounds and felt to be secondary to overeating.  Labs: 01/2017 - Hgb 14.4, potassium 4.3, serum creatinine 1.0, AST/ALT normal, albumin 4.3, LDL not calculated secondary to triglyceride of 432  Patient comes in doing well from a cardiac perspective.  He denies any chest pain.  He continues to note some exertional shortness of breath though this is improved when compared to his last office visit he reports.  He continues to attribute this to obesity and physical deconditioning.  Symptoms are not similar to his prior angina.  He continues to snack frequently on high sodium foods such as pork rinds, potato chips, cheeses, and she does.  In this setting, he notes elevated BP readings.  He has more recently cut back his snacking to just pork rinds.  His weight is down 17 pounds from his last visit with [place] in 05/2017.  He attributes the weight loss secondary to the Atkins diet.  He is compliant with all medications.  No lower extremity swelling, abdominal distention, orthopnea, PND, early satiety.  No dizziness, presyncope, or syncope.  No falls since he was last seen.  No BRBPR or melena.  Lastly, he notes some paresthesias affecting the bilateral bottoms of his feet.  Past Medical History:  Diagnosis Date  . CAD (coronary artery disease) 2012   a. s/p 2 vessel CABG (LIMA-LAD, SVG-PDA); b. cath 03/2011: LM 20%, mLAD 75%, pD1 100% s/p PCI/DES, mildly diseaed LCx, mRCA 80%, dRCA 95%, patent grafts  . Depression   . Gout   . Hyperlipidemia   . Hypertension   . Lyme disease     Past Surgical History:  Procedure Laterality Date  . CARDIAC CATHETERIZATION  12/25/2010  . CORONARY ANGIOPLASTY     stent x 1    . CORONARY ARTERY BYPASS GRAFT  11/2010   [facility]  . right index finger surgery      Current Meds  Medication Sig  . allopurinol (ZYLOPRIM) 300 MG tablet Take 150 mg by mouth daily.  Marland Kitchen aspirin EC 81 MG tablet Take 81 mg by mouth daily.   Marland Kitchen lisinopril (PRINIVIL,ZESTRIL) 40 MG tablet TAKE 1 TABLET BY MOUTH ONCE DAILY  . metoprolol tartrate (LOPRESSOR) 50 MG tablet TAKE 1 TABLET(50 MG) BY MOUTH TWICE DAILY  . nitroGLYCERIN (NITROSTAT) 0.4 MG SL tablet Place 1 tablet (0.4 mg total) under the tongue every 5 (five) minutes as needed for chest pain.  . Omega-3 Fatty Acids (FISH OIL) 1000 MG CAPS Take by mouth 2 (two) times daily.    Allergies:   Bupropion; Omeprazole; Prednisone; Trazodone; and Trazodone and nefazodone   Social History:  The patient  reports that he quit smoking about 41 years ago. His smoking use included cigarettes. He has never used smokeless tobacco. He reports current alcohol use of about 2.0 standard drinks of alcohol per week. He reports that he does not use drugs.   Family History:  The patient's family history includes Heart attack (age of onset: 51) in his father.  ROS:   Review of Systems  Constitutional: Positive for malaise/fatigue. Negative for chills, diaphoresis, fever and weight loss.  HENT: Negative for congestion.   Eyes: Negative for discharge and redness.  Respiratory: Positive for shortness of breath. Negative for cough, hemoptysis, sputum production and wheezing.   Cardiovascular: Negative for chest pain, palpitations, orthopnea, claudication, leg swelling and PND.  Gastrointestinal: Negative for abdominal pain, blood in stool, heartburn, melena, nausea and vomiting.  Genitourinary: Negative for hematuria.  Musculoskeletal: Negative for falls and myalgias.  Skin: Negative for rash.  Neurological: Negative for dizziness, tingling, tremors, sensory change, speech change, focal weakness, loss of consciousness and weakness.  Endo/Heme/Allergies: Does not  bruise/bleed easily.  Psychiatric/Behavioral: Negative for substance abuse. The patient is not nervous/anxious.   All other systems reviewed and are negative.    PHYSICAL EXAM:  VS:  BP 130/72 (BP Location: Left Arm, Patient Position: Sitting, Cuff Size: Normal)   Pulse 61   Ht 5\' 9"  (1.753 m)   Wt 208 lb 8 oz (94.6 kg)   BMI 30.79 kg/m  BMI: Body mass index is 30.79 kg/m.  Physical Exam  Constitutional: He is oriented to person, place, and time. He appears well-developed and well-nourished.  HENT:  Head: Normocephalic and atraumatic.  Eyes: Right eye exhibits no discharge. Left eye exhibits no discharge.  Neck: Normal range of motion. No JVD present.  Cardiovascular: Normal rate, regular rhythm, S1 normal and S2 normal. Exam reveals no distant heart sounds, no friction rub, no midsystolic click and no opening snap.  Murmur heard. Pulses:      Posterior tibial pulses are 2+ on the right side and 2+ on  the left side.  2/6 systolic murmur best heard in the right upper sternal border  Pulmonary/Chest: Effort normal and breath sounds normal. No respiratory distress. He has no decreased breath sounds. He has no wheezes. He has no rales. He exhibits no tenderness.  Abdominal: Soft. He exhibits no distension. There is no abdominal tenderness.  Musculoskeletal:        General: No edema.  Neurological: He is alert and oriented to person, place, and time.  Skin: Skin is warm and dry. No cyanosis. Nails show no clubbing.  Psychiatric: He has a normal mood and affect. His speech is normal and behavior is normal. Judgment and thought content normal.     EKG:  Was ordered and interpreted by me today. Shows NSR, 61 bpm, first-degree AV block, no acute ST-T changes  Recent Labs: No results found for requested labs within last 8760 hours.  No results found for requested labs within last 8760 hours.   CrCl cannot be calculated (Patient's most recent lab result is older than the maximum 21 days  allowed.).   Wt Readings from Last 3 Encounters:  12/09/18 208 lb 8 oz (94.6 kg)  06/03/17 225 lb 4 oz (102.2 kg)  11/23/16 218 lb 12 oz (99.2 kg)     Other studies reviewed: Additional studies/records reviewed today include: summarized above  ASSESSMENT AND PLAN:  1. CAD involving the native coronary arteries status post CABG status post PCI of the native D1 without angina: He is currently doing well without any symptoms suggestive of angina.  Continue current medical therapy with aspirin and Lopressor.  Not on statin secondary to intolerance.  Aggressive receptor modification and secondary prevention.  Refill sublingual nitroglycerin.  Patient indicates he has 9 bottles of this at home and has never needed to use 1 pill.  2. Cardiac murmur: Patient indicates he has been notified of this since age 37 when he was undergoing medical evaluation to enter the [employer].  Asymptomatic.  Schedule echocardiogram.  3. Dyspnea: Stable to somewhat improved.  Patient has attributed this to obesity and physical deconditioning.  Symptoms are not consistent with prior angina.  Schedule echocardiogram and check CBC.  4. Hypertension: Blood pressure is reasonably controlled today.  He attributes mildly elevated BP readings to snacks that are high in sodium.  Recommend he limit sodium intake.  5. Hyperlipidemia: Statin intolerant.  No recent LDL.  Goal LDL less than 70.  If LDL remains above goal consider addition of PCSK9 inhibitor.  6. Sleep disordered breathing/daytime somnolence: Refer to pulmonology for consideration of sleep study.  7. Pedal paresthesias: Check screening labs as above with recommendation to add on studies pending CBC.  Recommend he follow-up with PCP.  Disposition: F/u with Dr. Rockey Situ or an APP in 6 months, sooner if needed.  Current medicines are reviewed at length with the patient today.  The patient did not have any concerns regarding medicines.  Signed, Christell Faith,  PA-C 12/09/2018 1:32 PM     [address] [place], [place] [address] [phone]

## 2018-12-09 ENCOUNTER — Ambulatory Visit (INDEPENDENT_AMBULATORY_CARE_PROVIDER_SITE_OTHER): Payer: BC Managed Care – PPO | Admitting: Physician Assistant

## 2018-12-09 ENCOUNTER — Encounter: Payer: Self-pay | Admitting: Physician Assistant

## 2018-12-09 VITALS — BP 130/72 | HR 61 | Ht 69.0 in | Wt 208.5 lb

## 2018-12-09 DIAGNOSIS — R202 Paresthesia of skin: Secondary | ICD-10-CM

## 2018-12-09 DIAGNOSIS — I10 Essential (primary) hypertension: Secondary | ICD-10-CM

## 2018-12-09 DIAGNOSIS — R0600 Dyspnea, unspecified: Secondary | ICD-10-CM

## 2018-12-09 DIAGNOSIS — E785 Hyperlipidemia, unspecified: Secondary | ICD-10-CM | POA: Diagnosis not present

## 2018-12-09 DIAGNOSIS — I25810 Atherosclerosis of coronary artery bypass graft(s) without angina pectoris: Secondary | ICD-10-CM | POA: Diagnosis not present

## 2018-12-09 DIAGNOSIS — G4730 Sleep apnea, unspecified: Secondary | ICD-10-CM

## 2018-12-09 DIAGNOSIS — R400 Somnolence: Secondary | ICD-10-CM

## 2018-12-09 DIAGNOSIS — R011 Cardiac murmur, unspecified: Secondary | ICD-10-CM

## 2018-12-09 MED ORDER — NITROGLYCERIN 0.4 MG SL SUBL: 0.4000 mg | SUBLINGUAL_TABLET | SUBLINGUAL | 6 refills | Status: DC | PRN

## 2018-12-09 MED ORDER — LISINOPRIL 40 MG PO TABS: 40.0000 mg | ORAL_TABLET | Freq: Every day | ORAL | 3 refills | Status: DC

## 2018-12-09 MED ORDER — METOPROLOL TARTRATE 50 MG PO TABS: ORAL_TABLET | ORAL | 3 refills | Status: DC

## 2018-12-09 NOTE — Patient Instructions (Signed)
Medication Instructions:  Your physician recommends that you continue on your current medications as directed. Please refer to the Current Medication list given to you today.  If you need a refill on your cardiac medications before your next appointment, please call your pharmacy.   Lab work: Your physician recommends that you return for lab work today (CBC, CMET, direct LDL, lipid)   If you have labs (blood work) drawn today and your tests are completely normal, you will receive your results only by: Marland Kitchen MyChart Message (if you have MyChart) OR . A paper copy in the mail If you have any lab test that is abnormal or we need to change your treatment, we will call you to review the results.  Testing/Procedures: 1- Echo  Your physician has requested that you have an echocardiogram. Echocardiography is a painless test that uses sound waves to create images of your heart. It provides your doctor with information about the size and shape of your heart and how well your heart's chambers and valves are working. This procedure takes approximately one hour. There are no restrictions for this procedure.   Follow-Up: At [facility], you and your health needs are our priority.  As part of our continuing mission to provide you with exceptional heart care, we have created designated Provider Care Teams.  These Care Teams include your primary Cardiologist (physician) and Advanced Practice Providers (APPs -  Physician Assistants and Nurse Practitioners) who all work together to provide you with the care you need, when you need it. You will need a follow up appointment in 6 months.  Please call our office 2 months in advance to schedule this appointment.  You may see Dr. Rockey Situ or Christell Faith, PA-C  Any Other Special Instructions Will Be Listed Below (If Applicable). 1- Referral to Pulmonology  [phone] They should call you in a week, if not please call.

## 2018-12-10 ENCOUNTER — Encounter: Payer: Self-pay | Admitting: *Deleted

## 2018-12-10 ENCOUNTER — Telehealth: Payer: Self-pay | Admitting: *Deleted

## 2018-12-10 DIAGNOSIS — I10 Essential (primary) hypertension: Secondary | ICD-10-CM

## 2018-12-10 DIAGNOSIS — E875 Hyperkalemia: Secondary | ICD-10-CM

## 2018-12-10 DIAGNOSIS — Z79899 Other long term (current) drug therapy: Secondary | ICD-10-CM

## 2018-12-10 LAB — CBC
HEMATOCRIT: 42.8 % (ref 37.5–51.0)
Hemoglobin: 14.7 g/dL (ref 13.0–17.7)
MCH: 30 pg (ref 26.6–33.0)
MCHC: 34.3 g/dL (ref 31.5–35.7)
MCV: 87 fL (ref 79–97)
Platelets: 216 10*3/uL (ref 150–450)
RBC: 4.9 x10E6/uL (ref 4.14–5.80)
RDW: 12.9 % (ref 11.6–15.4)
WBC: 6.1 10*3/uL (ref 3.4–10.8)

## 2018-12-10 LAB — COMPREHENSIVE METABOLIC PANEL
ALT: 19 IU/L (ref 0–44)
AST: 19 IU/L (ref 0–40)
Albumin/Globulin Ratio: 2.3 — ABNORMAL HIGH (ref 1.2–2.2)
Albumin: 4.5 g/dL (ref 3.8–4.8)
Alkaline Phosphatase: 79 IU/L (ref 39–117)
BUN/Creatinine Ratio: 15 (ref 10–24)
BUN: 15 mg/dL (ref 8–27)
Bilirubin Total: 0.5 mg/dL (ref 0.0–1.2)
CHLORIDE: 102 mmol/L (ref 96–106)
CO2: 21 mmol/L (ref 20–29)
Calcium: 9.6 mg/dL (ref 8.6–10.2)
Creatinine, Ser: 1.01 mg/dL (ref 0.76–1.27)
GFR calc Af Amer: 89 mL/min/{1.73_m2} (ref >59)
GFR calc non Af Amer: 77 mL/min/{1.73_m2} (ref >59)
GLOBULIN, TOTAL: 2 g/dL (ref 1.5–4.5)
Glucose: 83 mg/dL (ref 65–99)
Potassium: 5.2 mmol/L (ref 3.5–5.2)
SODIUM: 140 mmol/L (ref 134–144)
Total Protein: 6.5 g/dL (ref 6.0–8.5)

## 2018-12-10 LAB — LIPID PANEL
Chol/HDL Ratio: 6.8 ratio — ABNORMAL HIGH (ref 0.0–5.0)
Cholesterol, Total: 267 mg/dL — ABNORMAL HIGH (ref 100–199)
HDL: 39 mg/dL — ABNORMAL LOW (ref >39)
LDL Calculated: 176 mg/dL — ABNORMAL HIGH (ref 0–99)
TRIGLYCERIDES: 258 mg/dL — AB (ref 0–149)
VLDL Cholesterol Cal: 52 mg/dL — ABNORMAL HIGH (ref 5–40)

## 2018-12-10 LAB — LDL CHOLESTEROL, DIRECT: LDL Direct: 200 mg/dL — ABNORMAL HIGH (ref 0–99)

## 2018-12-10 NOTE — Telephone Encounter (Signed)
-----   Message from Rise Mu, PA-C sent at 12/10/2018  8:53 AM EST ----- Random glucose is normal. Renal function normal. Potassium is high-end of normal. Liver function is normal. Blood count is normal.  LDL is elevated at 200 with a goal < 70.   With regards to his potassium, please make sure he is not adding seasonings to food that are high in potassium. We will need to recheck a potassium in 1 week to ensure this has improved and if this remains on the high end, we will need to change his lisinopril.  He is intolerant to statins, though his LDL is significantly above goal. I recommend we start him on Repatha 140 mg every 2 weeks or Praluent 75 mg every 2 weeks. Whichever is better covered by his insurance. He will need a recheck fasting lipid panel and liver function 8 weeks after starting the PCSK-9 inhibitor.

## 2018-12-10 NOTE — Telephone Encounter (Signed)
Results called to pt. Pt verbalized understanding. Appointment scheduled for him to come to office on 2/25 at 3 pm for repeat potassium level. He did not have a way to write down the Praluent and [place] names and asked me to text the names to him. Advised I could not text them to him but could mail him a letter (he does not have MyChart). He said that would be fine and verbalized understanding to call us once he finds out from his insurance company which medication they will cover.  He is aware he will need repeat lab work after staring new cholesterol medication.

## 2018-12-15 ENCOUNTER — Emergency Department: Payer: BC Managed Care – PPO

## 2018-12-15 ENCOUNTER — Other Ambulatory Visit: Payer: Self-pay

## 2018-12-15 ENCOUNTER — Emergency Department
Admission: EM | Admit: 2018-12-15 | Discharge: 2018-12-15 | Disposition: A | Discharge status: Home / Self Care | Payer: BC Managed Care – PPO | Attending: Emergency Medicine | Admitting: Emergency Medicine

## 2018-12-15 ENCOUNTER — Encounter: Payer: Self-pay | Admitting: Emergency Medicine

## 2018-12-15 DIAGNOSIS — R112 Nausea with vomiting, unspecified: Secondary | ICD-10-CM | POA: Diagnosis not present

## 2018-12-15 DIAGNOSIS — R42 Dizziness and giddiness: Secondary | ICD-10-CM | POA: Insufficient documentation

## 2018-12-15 DIAGNOSIS — Z7982 Long term (current) use of aspirin: Secondary | ICD-10-CM | POA: Insufficient documentation

## 2018-12-15 DIAGNOSIS — I10 Essential (primary) hypertension: Secondary | ICD-10-CM | POA: Diagnosis not present

## 2018-12-15 DIAGNOSIS — Z87891 Personal history of nicotine dependence: Secondary | ICD-10-CM | POA: Diagnosis not present

## 2018-12-15 DIAGNOSIS — I2510 Atherosclerotic heart disease of native coronary artery without angina pectoris: Secondary | ICD-10-CM | POA: Insufficient documentation

## 2018-12-15 DIAGNOSIS — Z951 Presence of aortocoronary bypass graft: Secondary | ICD-10-CM | POA: Insufficient documentation

## 2018-12-15 DIAGNOSIS — Z79899 Other long term (current) drug therapy: Secondary | ICD-10-CM | POA: Diagnosis not present

## 2018-12-15 LAB — COMPREHENSIVE METABOLIC PANEL
ALT: 20 U/L (ref 0–44)
AST: 22 U/L (ref 15–41)
Albumin: 3.9 g/dL (ref 3.5–5.0)
Alkaline Phosphatase: 64 U/L (ref 38–126)
Anion gap: 10 (ref 5–15)
BILIRUBIN TOTAL: 0.9 mg/dL (ref 0.3–1.2)
BUN: 22 mg/dL (ref 8–23)
CO2: 21 mmol/L — ABNORMAL LOW (ref 22–32)
Calcium: 9.1 mg/dL (ref 8.9–10.3)
Chloride: 107 mmol/L (ref 98–111)
Creatinine, Ser: 1.02 mg/dL (ref 0.61–1.24)
GFR calc Af Amer: >60 mL/min (ref >60)
GFR calc non Af Amer: >60 mL/min (ref >60)
Glucose, Bld: 150 mg/dL — ABNORMAL HIGH (ref 70–99)
POTASSIUM: 3.3 mmol/L — AB (ref 3.5–5.1)
Sodium: 138 mmol/L (ref 135–145)
TOTAL PROTEIN: 7 g/dL (ref 6.5–8.1)

## 2018-12-15 LAB — CBC WITH DIFFERENTIAL/PLATELET
Abs Immature Granulocytes: 0.03 10*3/uL (ref 0.00–0.07)
Basophils Absolute: 0.1 10*3/uL (ref 0.0–0.1)
Basophils Relative: 1 %
Eosinophils Absolute: 0.3 10*3/uL (ref 0.0–0.5)
Eosinophils Relative: 5 %
HCT: 43.1 % (ref 39.0–52.0)
Hemoglobin: 15.1 g/dL (ref 13.0–17.0)
Immature Granulocytes: 1 %
Lymphocytes Relative: 31 %
Lymphs Abs: 2 10*3/uL (ref 0.7–4.0)
MCH: 29.6 pg (ref 26.0–34.0)
MCHC: 35 g/dL (ref 30.0–36.0)
MCV: 84.5 fL (ref 80.0–100.0)
Monocytes Absolute: 0.4 10*3/uL (ref 0.1–1.0)
Monocytes Relative: 6 %
NRBC: 0 % (ref 0.0–0.2)
Neutro Abs: 3.6 10*3/uL (ref 1.7–7.7)
Neutrophils Relative %: 56 %
Platelets: 211 10*3/uL (ref 150–400)
RBC: 5.1 MIL/uL (ref 4.22–5.81)
RDW: 12.1 % (ref 11.5–15.5)
WBC: 6.4 10*3/uL (ref 4.0–10.5)

## 2018-12-15 LAB — TROPONIN I: Troponin I: <0.03 ng/mL (ref <0.03)

## 2018-12-15 MED ORDER — ONDANSETRON 4 MG PO TBDP: 4.0000 mg | ORAL_TABLET | Freq: Three times a day (TID) | ORAL | 0 refills | Status: DC | PRN

## 2018-12-15 MED ORDER — MECLIZINE HCL 25 MG PO TABS: 25.0000 mg | ORAL_TABLET | Freq: Three times a day (TID) | ORAL | 0 refills | Status: DC | PRN

## 2018-12-15 MED ORDER — ONDANSETRON HCL 4 MG/2ML IJ SOLN
4.0000 mg | Freq: Once | INTRAMUSCULAR | Status: AC
  Administered 2018-12-15: 4 mg via INTRAVENOUS

## 2018-12-15 MED ORDER — ONDANSETRON HCL 4 MG/2ML IJ SOLN
INTRAMUSCULAR | Status: AC
  Administered 2018-12-15: 4 mg via INTRAVENOUS
  Filled 2018-12-15: qty 2

## 2018-12-15 MED ORDER — MECLIZINE HCL 25 MG PO TABS
25.0000 mg | ORAL_TABLET | Freq: Once | ORAL | Status: AC
  Administered 2018-12-15: 25 mg via ORAL
  Filled 2018-12-15: qty 1

## 2018-12-15 NOTE — ED Provider Notes (Signed)
[facility] Emergency Department Provider Note  ____________________________________________   First MD Initiated Contact with Patient 12/15/18 [phone]     (approximate)  I have reviewed the triage vital signs and the nursing notes.   HISTORY  Chief Complaint Dizziness and Nausea    HPI Todd Collins is a 68 y.o. male with a history notable for significant coronary artery disease status post CABG with a stent who takes a daily baby aspirin.  He has no prior history of CVA but he reports multiple TIAs in the past.  He presents by EMS for acute onset severe dizziness/vertigo.  He says that he does not sleep well and was awake by 2:30 AM.  A couple of hours prior to arrival he suddenly became very dizzy which resulted in nausea and vomiting.  He describes it as a room spinning sensation.  Interestingly he states that he can maintain his balance just fine, but he cannot move without becoming sick.  If he remains still his symptoms are mild but any movement causes the symptoms to become severe.  While I was talking to him even though he was not moving he became acutely nauseated and had another episode of retching.  He has a mild headache and says his blood pressure was elevated when he checked it at home compared to his baseline.  The symptoms are severe and nothing is making them better and movement makes them worse.  He says that he has had a couple of episodes that he was concerned may be TIAs over the last couple of weeks.  Mostly this is involved some visual changes and the symptoms go away on their own.  He has not had them evaluated.  He denies fever/chills, chest pain, shortness of breath, and abdominal pain.  He has had no dysuria recently.  He has had no new medication changes and denies drugs and alcohol.     Past Medical History:  Diagnosis Date  . CAD (coronary artery disease) 2012   a. s/p 2 vessel CABG (LIMA-LAD, SVG-PDA); b. cath 03/2011: LM 20%, mLAD  75%, pD1 100% s/p PCI/DES, mildly diseaed LCx, mRCA 80%, dRCA 95%, patent grafts  . Depression   . Gout   . Hyperlipidemia   . Hypertension   . Lyme disease     Patient Active Problem List   Diagnosis Date Noted  . Smoking history 11/05/2016  . Essential hypertension 11/05/2016  . Angina pectoris ([place]) 09/08/2015  . Obesity 09/08/2015  . 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 Jaw pain 05/06/2014  . Coronary artery disease with angina pectoris ([place]) 05/06/2014  . History of coronary artery stent placement 05/06/2014  . Hyperlipidemia 05/06/2014  . Statin intolerance 05/06/2014  . Other specified transient cerebral ischemias 05/06/2014  . Pain in the chest 05/06/2014    Past Surgical History:  Procedure Laterality Date  . CARDIAC CATHETERIZATION  12/25/2010  . CORONARY ANGIOPLASTY     stent x 1   . CORONARY ARTERY BYPASS GRAFT  11/2010   [facility]  . right index finger surgery      Prior to Admission medications   Medication Sig Start Date End Date Taking? Authorizing Provider  allopurinol (ZYLOPRIM) 300 MG tablet Take 150 mg by mouth daily.    [provider]  aspirin EC 81 MG tablet Take 81 mg by mouth daily.     [provider]  lisinopril (PRINIVIL,ZESTRIL) 40 MG tablet Take 1 tablet (40 mg total) by mouth daily. 12/09/18  Rise Mu, PA-C  metoprolol tartrate (LOPRESSOR) 50 MG tablet TAKE 1 TABLET(50 MG) BY MOUTH TWICE DAILY 12/09/18   Dunn, Areta Haber, PA-C  nitroGLYCERIN (NITROSTAT) 0.4 MG SL tablet Place 1 tablet (0.4 mg total) under the tongue every 5 (five) minutes as needed for chest pain. 12/09/18   Dunn, Areta Haber, PA-C  Omega-3 Fatty Acids (FISH OIL) 1000 MG CAPS Take by mouth 2 (two) times daily.    [provider]    Allergies Bupropion; Omeprazole; Prednisone; Trazodone; and Trazodone and nefazodone  Family History  Problem Relation Age of Onset  . Heart attack Father 35    Social History Social History   Tobacco Use  . Smoking status: Former  Smoker    Types: Cigarettes    Last attempt to quit: 12/03/1977    Years since quitting: 41.0  . Smokeless tobacco: Never Used  Substance Use Topics  . Alcohol use: Yes    Alcohol/week: 2.0 standard drinks    Types: 2 Cans of beer per week    Comment: daily  . Drug use: No    Review of Systems Constitutional: No fever/chills Eyes: No visual changes. ENT: No sore throat. Cardiovascular: Denies chest pain. Respiratory: Denies shortness of breath. Gastrointestinal: No abdominal pain.  No nausea, no vomiting.  No diarrhea.  No constipation. Genitourinary: Negative for dysuria. Musculoskeletal: Negative for neck pain.  Negative for back pain. Integumentary: Negative for rash. Neurological:    ____________________________________________   PHYSICAL EXAM:  VITAL SIGNS: ED Triage Vitals  Enc Vitals Group     BP 12/15/18 0547 (!) 141/81     Pulse Rate 12/15/18 0547 70     Resp 12/15/18 0547 17     Temp 12/15/18 0547 (!) 97.5 F (36.4 C)     Temp Source 12/15/18 0547 Oral     SpO2 12/15/18 0547 100 %     Weight 12/15/18 0549 94.3 kg (208 lb)     Height 12/15/18 0549 1.753 m (5\' 9" )     Head Circumference --      Peak Flow --      Pain Score 12/15/18 0549 0     Pain Loc --      Pain Edu? --      Excl. in [place]? --     Constitutional: Alert and oriented. Appears uncomfortable but not in distress. Eyes: Conjunctivae are normal. PERRL. EOMI.  No nystagmus. Head: Atraumatic. Nose: No congestion/rhinnorhea. Mouth/Throat: Mucous membranes are moist. Neck: No stridor.  No meningeal signs.   Cardiovascular: Normal rate, regular rhythm. Good peripheral circulation. Grossly normal heart sounds. Respiratory: Normal respiratory effort.  No retractions. Lungs CTAB. Gastrointestinal: Soft and nontender. No distention.  Musculoskeletal: No lower extremity tenderness nor edema. No gross deformities of extremities. Neurologic:  Normal speech and language. No gross focal neurologic  deficits are appreciated, but the patient cannot participate fully in a comprehensive exam due to his vertigo. Skin:  Skin is warm, dry and intact. No rash noted.   ____________________________________________   LABS (all labs ordered are listed, but only abnormal results are displayed)  Labs Reviewed  COMPREHENSIVE METABOLIC PANEL - Abnormal; Notable for the following components:      Result Value   Potassium 3.3 (*)    CO2 21 (*)    Glucose, Bld 150 (*)    All other components within normal limits  CBC WITH DIFFERENTIAL/PLATELET  TROPONIN I   ____________________________________________  EKG  ED ECG REPORT I, Hinda Kehr, the attending physician,  personally viewed and interpreted this ECG.  Date: 12/15/2018 EKG Time: 5:48 AM Rate: 69 Rhythm: normal sinus rhythm QRS Axis: normal Intervals: normal ST/T Wave abnormalities: Non-specific ST segment / T-wave changes, but no clear evidence of acute ischemia. Narrative Interpretation: no definitive evidence of acute ischemia; does not meet STEMI criteria.   ____________________________________________  RADIOLOGY   ED MD interpretation: No acute abnormalities on head CT.  Official radiology report(s): Ct Head Wo Contrast  Result Date: 12/15/2018 CLINICAL DATA:  68 y/o M; 2 episodes of vomiting, ataxia, stroke suspected. EXAM: CT HEAD WITHOUT CONTRAST TECHNIQUE: Contiguous axial images were obtained from the base of the skull through the vertex without intravenous contrast. COMPARISON:  09/23/2014 CT head. FINDINGS: Brain: No evidence of acute infarction, hemorrhage, hydrocephalus, extra-axial collection or mass lesion/mass effect. Vascular: No hyperdense vessel or unexpected calcification. Skull: Normal. Negative for fracture or focal lesion. Sinuses/Orbits: No acute finding. Other: None. IMPRESSION: No acute intracranial abnormality identified. Stable unremarkable CT of the head for age. Electronically Signed   By: Kristine Garbe M.D.   On: 12/15/2018 06:26    ____________________________________________   PROCEDURES   Procedure(s) performed (including Critical Care):  Procedures   ____________________________________________   INITIAL IMPRESSION / MDM / [place] / ED COURSE  As part of my medical decision making, I reviewed the following data within the [facility] notes reviewed and incorporated, Labs reviewed , EKG interpreted , Old chart reviewed, Patient signed out to Dr. Mariea Clonts and Notes from prior ED visits       Differential diagnosis includes, but is not limited to, central vertigo (CVA), peripheral vertigo, acute infection, electrolyte or metabolic abnormality.  The patient is in distress as result of the dizziness and nausea and vomiting.  Given his significant cardiovascular history and his report of episodes that sound similar to TIAs, I think it is important to obtain an MR brain and MRA head to evaluate the possibility of a cerebellar stroke or other similar CNS compromise that is causing his vertigo.  I am giving a dose of Zofran 4 mg IV and meclizine 25 mg by mouth and have ordered the MRIs.  His CT head is reassuring with no acute abnormalities and his lab work is within normal limits except for slightly decreased potassium which may be due to his vomiting.  CBC is normal and troponin is negative.  EKG shows no signs of ischemia.  He understands and agrees with the plan.  I have transferred care of the patient to Dr. Mariea Clonts to follow-up on the MRIs and reassess.     ____________________________________________  FINAL CLINICAL IMPRESSION(S) / ED DIAGNOSES  Final diagnoses:  Vertigo  Nausea and vomiting, intractability of vomiting not specified, unspecified vomiting type     MEDICATIONS GIVEN DURING THIS VISIT:  Medications  ondansetron (ZOFRAN) injection 4 mg (4 mg Intravenous Given 12/15/18 0629)  meclizine (ANTIVERT) tablet 25 mg  (25 mg Oral Given 12/15/18 2542)     ED Discharge Orders    None       Note:  This document was prepared using Dragon voice recognition software and may include unintentional dictation errors.   Hinda Kehr, MD 12/15/18 [phone]

## 2018-12-15 NOTE — ED Provider Notes (Signed)
Patient was signed out to me by Dr. Les Pou.  68 year old male with multiple chronic medical illnesses presenting with acute onset of vertiginous symptoms.  Overall, the patient has remained hemodynamically stable in the emergency department and states his symptoms have almost completely resolved with symptomatic treatment.  His MRI and MRA do not show any acute process.  I have given him his results, and will discharge him home with meclizine and Zofran.  He understands follow-up instructions as well as return precautions.  I have given him the number for the [facility] clinic to establish PMD care.  He does have a cardiology appointment on Thursday.   Eula Listen, MD 12/15/18 1122

## 2018-12-15 NOTE — Discharge Instructions (Addendum)
Please call the [facility] clinic to establish a primary care physician for chronic illnesses and for follow-up from your emergency department stay today.  You may take meclizine for dizziness and Zofran for nausea.  Please return to the emergency department if you develop worsening symptoms, including severe headache and dizziness, lightheadedness or fainting, fever, numbness tingling or weakness, visual or speech changes, changes in mental status, or any other symptoms concerning to you.

## 2018-12-15 NOTE — ED Triage Notes (Signed)
Patient presents to Emergency Department via [facility] from home with complaints of 2 episodes of vomiting with movement in the last 24 hours  Nausea in triage   History of CABG in 2012 and HTN, gout

## 2018-12-15 NOTE — ED Notes (Signed)
Patient transported to CT 

## 2018-12-15 NOTE — ED Notes (Signed)
Patient remains in MRI 

## 2018-12-16 ENCOUNTER — Other Ambulatory Visit (INDEPENDENT_AMBULATORY_CARE_PROVIDER_SITE_OTHER): Payer: BC Managed Care – PPO

## 2018-12-16 DIAGNOSIS — Z79899 Other long term (current) drug therapy: Secondary | ICD-10-CM

## 2018-12-16 DIAGNOSIS — I10 Essential (primary) hypertension: Secondary | ICD-10-CM

## 2018-12-16 DIAGNOSIS — E875 Hyperkalemia: Secondary | ICD-10-CM

## 2018-12-17 LAB — POTASSIUM: Potassium: 4.3 mmol/L (ref 3.5–5.2)

## 2018-12-19 ENCOUNTER — Encounter: Payer: Self-pay | Admitting: Internal Medicine

## 2018-12-19 ENCOUNTER — Ambulatory Visit: Payer: BC Managed Care – PPO | Admitting: Internal Medicine

## 2018-12-19 VITALS — BP 118/64 | HR 61 | Resp 16 | Ht 69.0 in | Wt 209.0 lb

## 2018-12-19 DIAGNOSIS — G4719 Other hypersomnia: Secondary | ICD-10-CM | POA: Diagnosis not present

## 2018-12-19 DIAGNOSIS — Z8673 Personal history of transient ischemic attack (TIA), and cerebral infarction without residual deficits: Secondary | ICD-10-CM | POA: Insufficient documentation

## 2018-12-19 DIAGNOSIS — F329 Major depressive disorder, single episode, unspecified: Secondary | ICD-10-CM | POA: Insufficient documentation

## 2018-12-19 DIAGNOSIS — M109 Gout, unspecified: Secondary | ICD-10-CM | POA: Insufficient documentation

## 2018-12-19 DIAGNOSIS — F32A Depression, unspecified: Secondary | ICD-10-CM | POA: Insufficient documentation

## 2018-12-19 NOTE — Patient Instructions (Signed)

## 2018-12-19 NOTE — Progress Notes (Addendum)
[place] [facility] Consultation      Assessment and Plan:  Excessive daytime sleepiness. -Symptoms and signs of obstructive sleep apnea. - We will send for sleep study.  Essential hypertension. - Sleep apnea can contribute to above condition, therefore treatment of sleep apnea is important part of management.  Addendum: [employer] denied HST order, will order in lab sleep study.  Date: 12/19/2018  MRN# [id number] JASKARAN DAUZAT 1951/02/17    Todd Collins is a 68 y.o. old male seen in consultation for chief complaint of:    Chief Complaint  Patient presents with  . Consult    Referred by Christell Faith for eval of OSA.pt has no trouble falling asleep. He awakens every morning around 3 am and has trouble falling back to sleep.    HPI:   He goes to bed around 830 pm, falls asleep easily. Wakes around 3:30 regularly, then has trouble falling asleep. He listens to talk show, then falls asleep 430 until 530 am. Then he will be up, works at [employer]. 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  He is slightly sleepy but can not make himself go to sleep.  He has never been tested for OSA. He has been told that he snores and that he stops breathing in his sleep. On weekends schedule is the same.   Denies sleep paralysis, sleep walking, cataplexy, denies jaw pain or TMJ.     PMHX:   Past Medical History:  Diagnosis Date  . CAD (coronary artery disease) 2012   a. s/p 2 vessel CABG (LIMA-LAD, SVG-PDA); b. cath 03/2011: LM 20%, mLAD 75%, pD1 100% s/p PCI/DES, mildly diseaed LCx, mRCA 80%, dRCA 95%, patent grafts  . Depression   . Gout   . Hyperlipidemia   . Hypertension   . Lyme disease    Surgical Hx:  Past Surgical History:  Procedure Laterality Date  . CARDIAC CATHETERIZATION  12/25/2010  . CORONARY ANGIOPLASTY     stent x 1   . CORONARY ARTERY BYPASS GRAFT  11/2010   [facility]  . right index finger surgery     Family Hx:  Family History  Problem Relation Age of Onset    . Heart attack Father 56   Social Hx:   Social History   Tobacco Use  . Smoking status: Former Smoker    Types: Cigarettes    Last attempt to quit: 12/03/1977    Years since quitting: 41.0  . Smokeless tobacco: Never Used  Substance Use Topics  . Alcohol use: Yes    Alcohol/week: 2.0 standard drinks    Types: 2 Cans of beer per week    Comment: daily  . Drug use: No   Medication:    Current Outpatient Medications:  .  allopurinol (ZYLOPRIM) 300 MG tablet, Take 150 mg by mouth daily., Disp: , Rfl:  .  aspirin EC 81 MG tablet, Take 81 mg by mouth daily. , Disp: , Rfl:  .  lisinopril (PRINIVIL,ZESTRIL) 40 MG tablet, Take 1 tablet (40 mg total) by mouth daily., Disp: 90 tablet, Rfl: 3 .  meclizine (ANTIVERT) 25 MG tablet, Take 1 tablet (25 mg total) by mouth 3 (three) times daily as needed for dizziness or nausea., Disp: 15 tablet, Rfl: 0 .  metoprolol tartrate (LOPRESSOR) 50 MG tablet, TAKE 1 TABLET(50 MG) BY MOUTH TWICE DAILY, Disp: 180 tablet, Rfl: 3 .  nitroGLYCERIN (NITROSTAT) 0.4 MG SL tablet, Place 1 tablet (0.4 mg total) under the tongue every  5 (five) minutes as needed for chest pain., Disp: 25 tablet, Rfl: 6 .  Omega-3 Fatty Acids (FISH OIL) 1000 MG CAPS, Take by mouth 2 (two) times daily., Disp: , Rfl:  .  ondansetron (ZOFRAN ODT) 4 MG disintegrating tablet, Take 1 tablet (4 mg total) by mouth every 8 (eight) hours as needed., Disp: 15 tablet, Rfl: 0   Allergies:  Bupropion; Omeprazole; Prednisone; Trazodone; and Trazodone and nefazodone  Review of Systems: Gen:  Denies  fever, sweats, chills HEENT: Denies blurred vision, double vision. bleeds, sore throat Cvc:  No dizziness, chest pain. Resp:   Denies cough or sputum production, shortness of breath Gi: Denies swallowing difficulty, stomach pain. Gu:  Denies bladder incontinence, burning urine Ext:   No Joint pain, stiffness. Skin: No skin rash,  hives  Endoc:  No polyuria, polydipsia. Psych: No depression,  insomnia. Other:  All other systems were reviewed with the patient and were negative other that what is mentioned in the HPI.   Physical Examination:   VS: BP 118/64 (BP Location: Left Arm, Cuff Size: Normal)   Pulse 61   Resp 16   Ht 5\' 9"  (1.753 m)   Wt 209 lb (94.8 kg)   SpO2 97%   BMI 30.86 kg/m   General Appearance: No distress  Neuro:without focal findings,  speech normal,  HEENT: PERRLA, EOM intact.   Pulmonary: normal breath sounds, No wheezing.  CardiovascularNormal S1,S2.  No m/r/g.   Abdomen: Benign, Soft, non-tender. Renal:  No costovertebral tenderness  GU:  No performed at this time. Endoc: No evident thyromegaly, no signs of acromegaly. Skin:   warm, no rashes, no ecchymosis  Extremities: normal, no cyanosis, clubbing.  Other findings:    LABORATORY PANEL:   CBC Recent Labs  Lab 12/15/18 0556  WBC 6.4  HGB 15.1  HCT 43.1  PLT 211   ------------------------------------------------------------------------------------------------------------------  Chemistries  Recent Labs  Lab 12/15/18 0556 12/16/18 1511  NA 138  --   K 3.3* 4.3  CL 107  --   CO2 21*  --   GLUCOSE 150*  --   BUN 22  --   CREATININE 1.02  --   CALCIUM 9.1  --   AST 22  --   ALT 20  --   ALKPHOS 64  --   BILITOT 0.9  --    ------------------------------------------------------------------------------------------------------------------  Cardiac Enzymes Recent Labs  Lab 12/15/18 0556  TROPONINI <0.03   ------------------------------------------------------------  RADIOLOGY:  No results found.     Thank  you for the consultation and for allowing [facility] to assist in the care of your patient. Our recommendations are noted above.  Please contact us if we can be of further service.   Marda Stalker, M.D., F.C.C.P.  Board Certified in Internal Medicine, Pulmonary Medicine, [place], and Sleep Medicine.  [place]  [facility] Office Number: [phone]   12/19/2018

## 2018-12-19 NOTE — Progress Notes (Signed)
[facility] Consultation      Assessment and Plan:  Excessive daytime sleepiness. - Symptoms and signs of obstructive sleep apnea. - We will send for sleep study.  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  Insomnia. - Patient has sleep maintenance insomnia, wakes up for about 1 hour per night. - Looking at the patient's total sleep time, however his total sleep time appears to be normal.  Explained that he may just have a sleep variant, and if his total sleep time is normal he may not need to adjust it.  Essential hypertension. - Obstructive sleep apnea can contribute to essential hypertension, therefore treatment of sleep apnea is important part of management.   Date: 12/19/2018  MRN# [id number] Todd Collins 20-Jan-1951    Todd Collins is a 68 y.o. old male seen in consultation for chief complaint of:    Chief Complaint  Patient presents with  . Consult    Referred by Christell Faith for eval of OSA.pt has no trouble falling asleep. He awakens every morning around 3 am and has trouble falling back to sleep.    HPI:   The patient is a 68 year old male, he has a CDL, works for the city.  Patient notes that he goes to bed around 8:30 PM, sleeps until 330.  He then wakes up, is not able to go back to sleep, listens to radio and falls back to sleep around 430.  He then sleeps for an additional hour until 5:30 AM.  He has tried taking Tylenol PM and antihistamines in the past to help him sleep.  They have been helpful, but cause daytime sleepiness.  His sleep pattern is unchanged on the weekends. He has been told that he snores, and has had witnessed apneas.  He is mildly sleepy during the day, but does not take naps during the day, does not fall asleep during the day.  Epworth score is 1 today. He has no objective complaints at this time. He has never been tested for OSA. He has been told that he snores and that he stops breathing in his  sleep. On weekends schedule is the same.   Denies sleep paralysis, sleep walking, cataplexy, denies jaw pain or TMJ.   PMHX:   Past Medical History:  Diagnosis Date  . CAD (coronary artery disease) 2012   a. s/p 2 vessel CABG (LIMA-LAD, SVG-PDA); b. cath 03/2011: LM 20%, mLAD 75%, pD1 100% s/p PCI/DES, mildly diseaed LCx, mRCA 80%, dRCA 95%, patent grafts  . Depression   . Gout   . Hyperlipidemia   . Hypertension   . Lyme disease    Surgical Hx:  Past Surgical History:  Procedure Laterality Date  . CARDIAC CATHETERIZATION  12/25/2010  . CORONARY ANGIOPLASTY     stent x 1   . CORONARY ARTERY BYPASS GRAFT  11/2010   [facility]  . right index finger surgery     Family Hx:  Family History  Problem Relation Age of Onset  . Heart attack Father 42   Social Hx:   Social History   Tobacco Use  . Smoking status: Former Smoker    Types: Cigarettes    Last attempt to quit: 12/03/1977    Years since quitting: 41.0  . Smokeless tobacco: Never Used  Substance Use Topics  . Alcohol use: Yes    Alcohol/week: 2.0 standard drinks    Types: 2 Cans of beer per week    Comment: daily  .  Drug use: No   Medication:    Current Outpatient Medications:  .  allopurinol (ZYLOPRIM) 300 MG tablet, Take 150 mg by mouth daily., Disp: , Rfl:  .  aspirin EC 81 MG tablet, Take 81 mg by mouth daily. , Disp: , Rfl:  .  lisinopril (PRINIVIL,ZESTRIL) 40 MG tablet, Take 1 tablet (40 mg total) by mouth daily., Disp: 90 tablet, Rfl: 3 .  meclizine (ANTIVERT) 25 MG tablet, Take 1 tablet (25 mg total) by mouth 3 (three) times daily as needed for dizziness or nausea., Disp: 15 tablet, Rfl: 0 .  metoprolol tartrate (LOPRESSOR) 50 MG tablet, TAKE 1 TABLET(50 MG) BY MOUTH TWICE DAILY, Disp: 180 tablet, Rfl: 3 .  nitroGLYCERIN (NITROSTAT) 0.4 MG SL tablet, Place 1 tablet (0.4 mg total) under the tongue every 5 (five) minutes as needed for chest pain., Disp: 25 tablet, Rfl: 6 .  Omega-3 Fatty Acids (FISH OIL) 1000 MG  CAPS, Take by mouth 2 (two) times daily., Disp: , Rfl:  .  ondansetron (ZOFRAN ODT) 4 MG disintegrating tablet, Take 1 tablet (4 mg total) by mouth every 8 (eight) hours as needed., Disp: 15 tablet, Rfl: 0   Allergies:  Bupropion; Omeprazole; Prednisone; Trazodone; and Trazodone and nefazodone  Review of Systems: Gen:  Denies  fever, sweats, chills HEENT: Denies blurred vision, double vision. bleeds, sore throat Cvc:  No dizziness, chest pain. Resp:   Denies cough or sputum production, shortness of breath Gi: Denies swallowing difficulty, stomach pain. Gu:  Denies bladder incontinence, burning urine Ext:   No Joint pain, stiffness. Skin: No skin rash,  hives  Endoc:  No polyuria, polydipsia. Psych: No depression, insomnia. Other:  All other systems were reviewed with the patient and were negative other that what is mentioned in the HPI.   Physical Examination:   VS: BP 118/64 (BP Location: Left Arm, Cuff Size: Normal)   Pulse 61   Resp 16   Ht 5\' 9"  (1.753 m)   Wt 209 lb (94.8 kg)   SpO2 97%   BMI 30.86 kg/m   General Appearance: No distress  Neuro:without focal findings,  speech normal,  HEENT: PERRLA, EOM intact.   Pulmonary: normal breath sounds, No wheezing.  CardiovascularNormal S1,S2.  No m/r/g.   Abdomen: Benign, Soft, non-tender. Renal:  No costovertebral tenderness  GU:  No performed at this time. Endoc: No evident thyromegaly, no signs of acromegaly. Skin:   warm, no rashes, no ecchymosis  Extremities: normal, no cyanosis, clubbing.  Other findings:    LABORATORY PANEL:   CBC Recent Labs  Lab 12/15/18 0556  WBC 6.4  HGB 15.1  HCT 43.1  PLT 211   ------------------------------------------------------------------------------------------------------------------  Chemistries  Recent Labs  Lab 12/15/18 0556 12/16/18 1511  NA 138  --   K 3.3* 4.3  CL 107  --   CO2 21*  --   GLUCOSE 150*  --   BUN 22  --   CREATININE 1.02  --   CALCIUM 9.1  --    AST 22  --   ALT 20  --   ALKPHOS 64  --   BILITOT 0.9  --    ------------------------------------------------------------------------------------------------------------------  Cardiac Enzymes Recent Labs  Lab 12/15/18 0556  TROPONINI <0.03   ------------------------------------------------------------  RADIOLOGY:  No results found.     Thank  you for the consultation and for allowing [facility] to assist in the care of your patient. Our recommendations are noted above.  Please contact us  if we can be of further service.   Marda Stalker, M.D., F.C.C.P.  Board Certified in Internal Medicine, Pulmonary Medicine, [place], and Sleep Medicine.  [facility] Office Number: [phone]   12/19/2018

## 2018-12-25 ENCOUNTER — Telehealth: Payer: Self-pay | Admitting: Internal Medicine

## 2018-12-25 NOTE — Telephone Encounter (Signed)
Ordered split night.

## 2018-12-25 NOTE — Telephone Encounter (Signed)
Form submitted to Sleep Med. Pt is aware of referral. Nothing else needed at this time. Rhonda J Cobb

## 2018-12-25 NOTE — Addendum Note (Signed)
Addended by: Laverle Hobby on: 12/25/2018 02:25 PM   Modules accepted: Orders

## 2018-12-25 NOTE — Telephone Encounter (Signed)
[employer] denied HST. Called and spoke with patient and he is aware of the above.  Advised patient that in order to diagnosis his issue an in lab sleep study will need to be ordered.    Pt stated that he is willing to proceed with in lab study.  Please place order.    Thank you Catha Gosselin

## 2018-12-26 ENCOUNTER — Ambulatory Visit: Payer: BC Managed Care – PPO | Attending: Internal Medicine

## 2018-12-26 DIAGNOSIS — G4710 Hypersomnia, unspecified: Secondary | ICD-10-CM | POA: Diagnosis present

## 2018-12-30 DIAGNOSIS — G4733 Obstructive sleep apnea (adult) (pediatric): Secondary | ICD-10-CM | POA: Diagnosis not present

## 2019-01-01 ENCOUNTER — Telehealth: Payer: Self-pay

## 2019-01-01 DIAGNOSIS — G4733 Obstructive sleep apnea (adult) (pediatric): Secondary | ICD-10-CM

## 2019-01-01 NOTE — Telephone Encounter (Signed)
Spoke to patient regarding sleep study. AHI of 9, however in supine position AHI increases to 41.  Recommend avoid sleeping in supine position, also Auto-CPAP with pressure range of 5-20 cm H2O.   Patient aware. Orders placed.

## 2019-01-08 ENCOUNTER — Other Ambulatory Visit: Payer: BC Managed Care – PPO

## 2019-03-02 ENCOUNTER — Ambulatory Visit: Payer: BC Managed Care – PPO | Admitting: Internal Medicine

## 2019-04-16 ENCOUNTER — Telehealth: Payer: Self-pay | Admitting: Cardiovascular Disease

## 2019-04-16 NOTE — Telephone Encounter (Signed)

## 2019-04-17 ENCOUNTER — Ambulatory Visit (INDEPENDENT_AMBULATORY_CARE_PROVIDER_SITE_OTHER): Payer: BC Managed Care – PPO

## 2019-04-17 ENCOUNTER — Other Ambulatory Visit: Payer: Self-pay

## 2019-04-17 DIAGNOSIS — R0600 Dyspnea, unspecified: Secondary | ICD-10-CM | POA: Diagnosis not present

## 2019-04-17 MED ORDER — PERFLUTREN LIPID MICROSPHERE
1.0000 mL | INTRAVENOUS | Status: AC | PRN
  Administered 2019-04-17: 2 mL via INTRAVENOUS

## 2019-04-20 ENCOUNTER — Telehealth: Payer: Self-pay

## 2019-04-20 DIAGNOSIS — I350 Nonrheumatic aortic (valve) stenosis: Secondary | ICD-10-CM

## 2019-04-20 NOTE — Telephone Encounter (Signed)
Spoke with the to the pt and made him aware of the echo results and Christell Faith, PA recommendation. Pt verbalized understanding and voiced appreciation for the call.

## 2019-04-20 NOTE — Telephone Encounter (Signed)
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 This can be monitored periodically with echocardiograms over the years. Repeat echo in 12 months.

## 2019-09-04 IMAGING — CT CT HEAD W/O CM
3 series · 16 of 47 positions shown, 19 images · non-contrast
Comparison: 09/23/2014 CT head.

CLINICAL DATA: 67 y/o M; 2 episodes of vomiting, ataxia, stroke
suspected.

EXAM:
CT HEAD WITHOUT CONTRAST
TECHNIQUE: Contiguous axial images were obtained from the base of the skull
through the vertex without intravenous contrast.

[Series 2: head wo · axial · 0.44mm/px · z∈[-83,+42]mm · 10 of 30 slices shown, 13 images]
[im 3/30  brain]
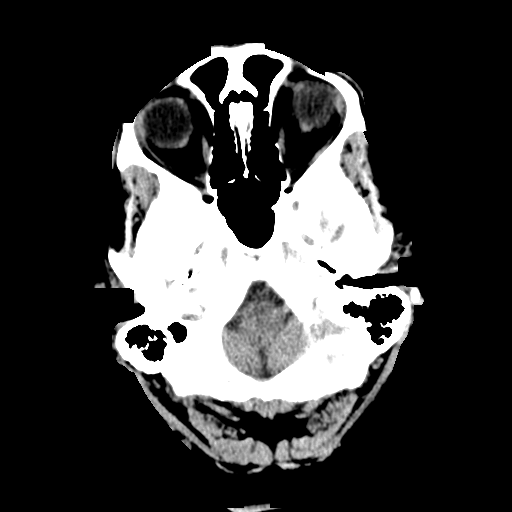
[im 3/30  bone]
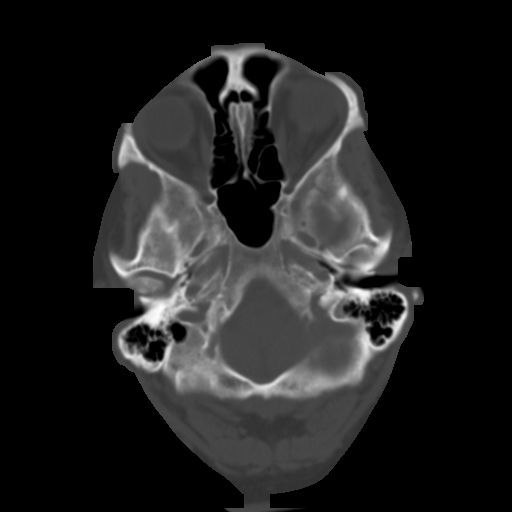
[im 6/30  brain]
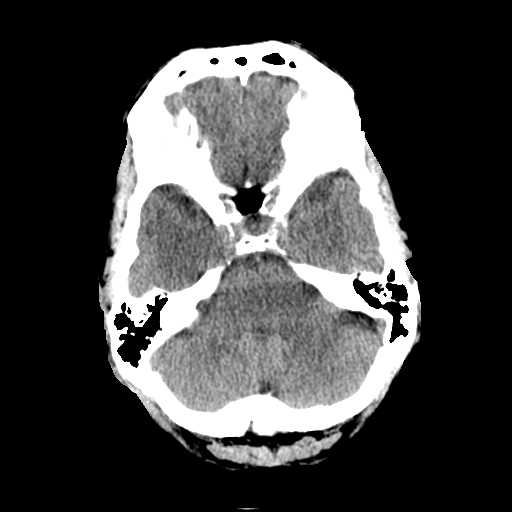
[im 9/30  brain]
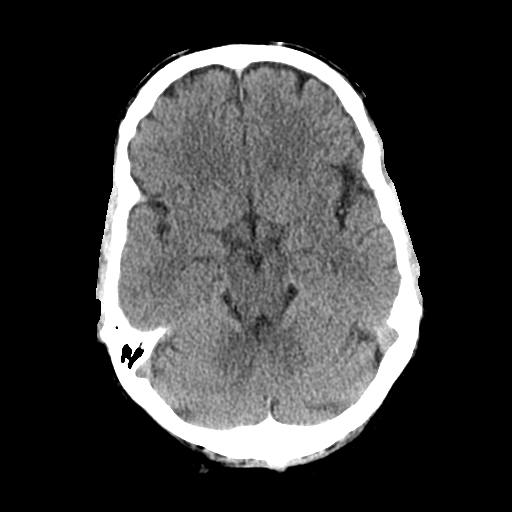
[im 11/30  brain]
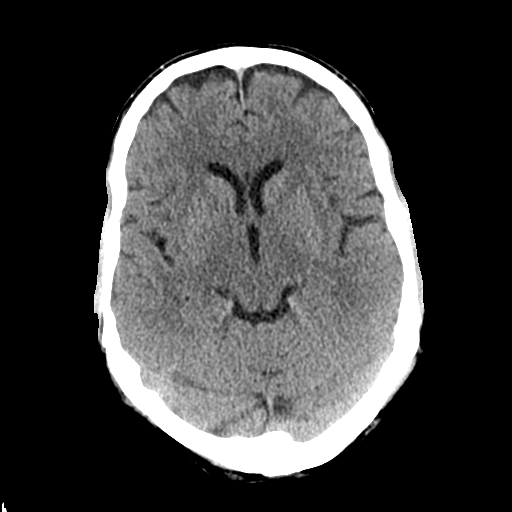
[im 14/30  brain]
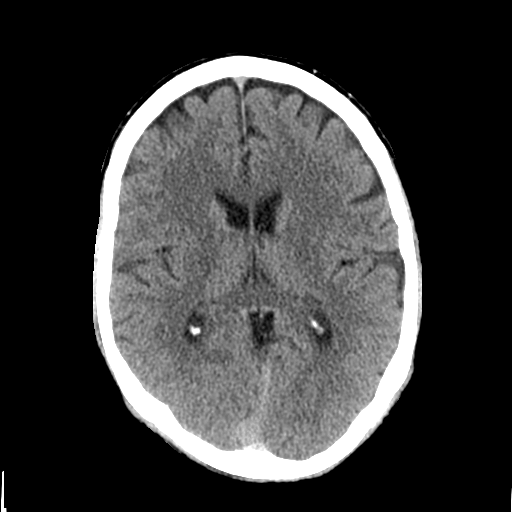
[im 14/30  bone]
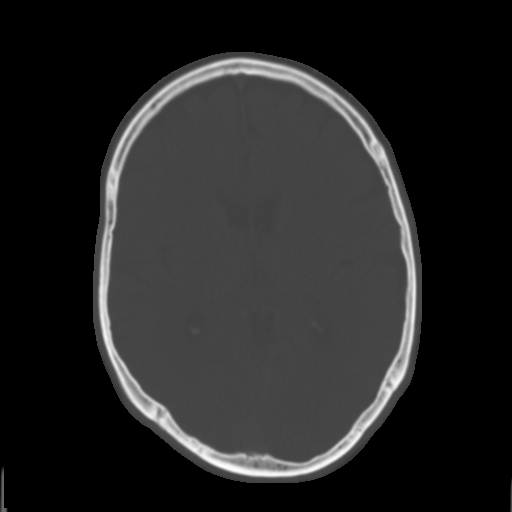
[im 17/30  brain]
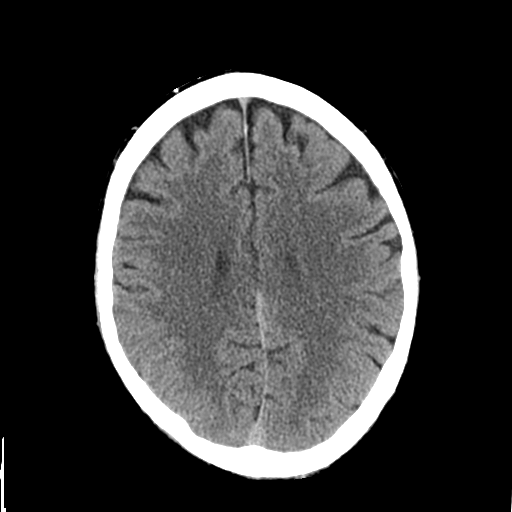
[im 20/30  brain]
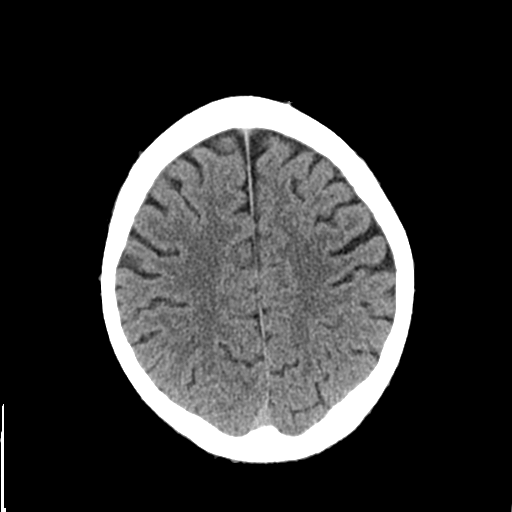
[im 23/30  brain]
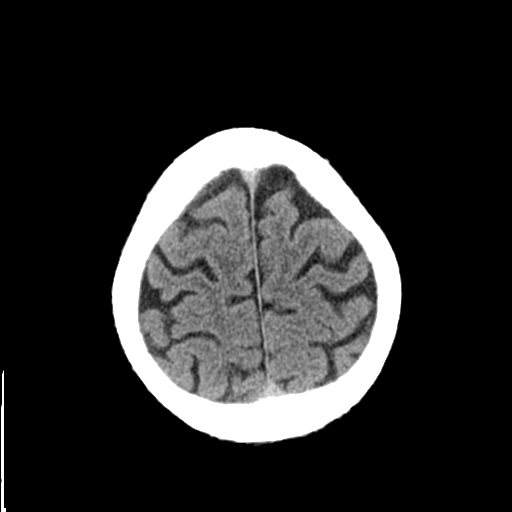
[im 25/30  brain]
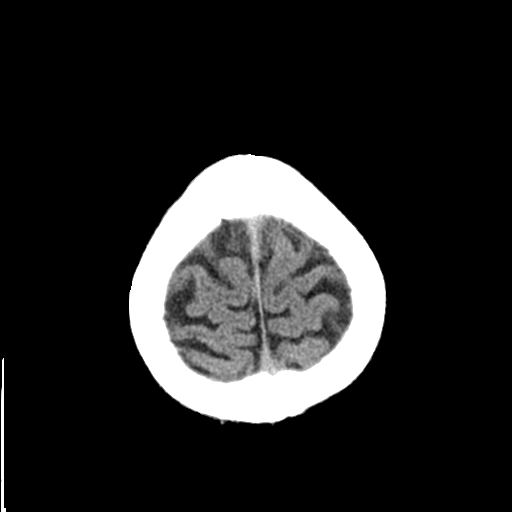
[im 25/30  bone]
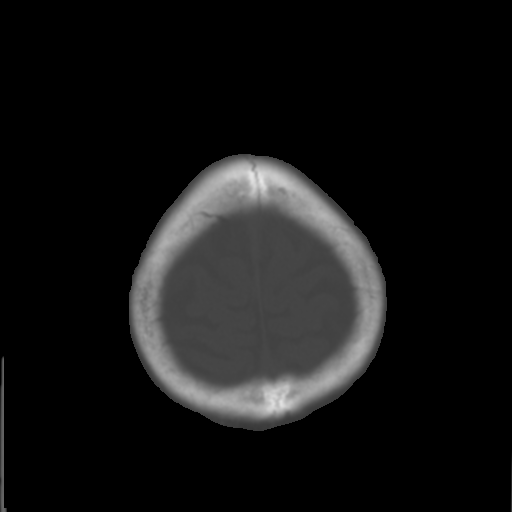
[im 28/30  brain]
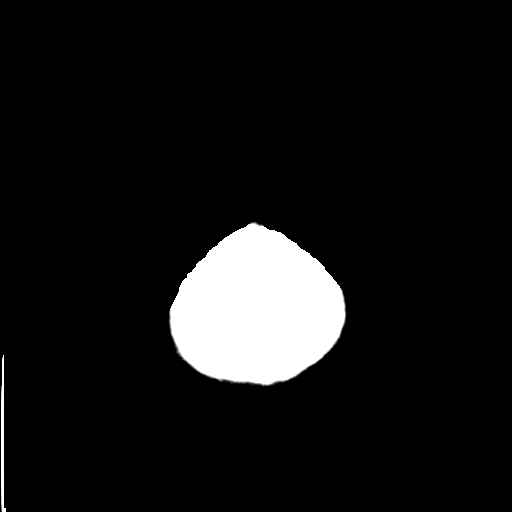

[Series 4: coronal soft tissue · coronal · 0.28mm/px · 3 of 64 slices shown]
[im 22/64  brain]
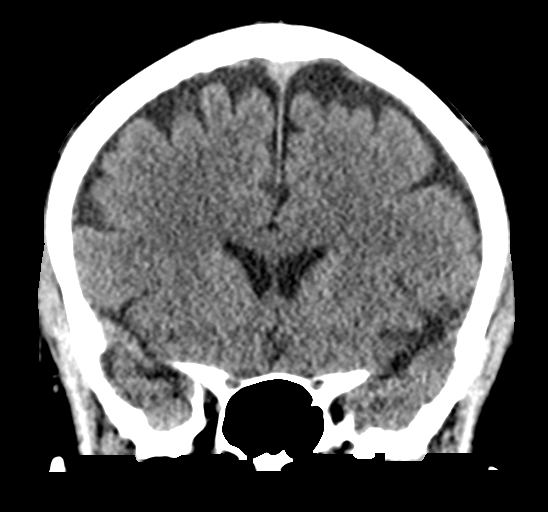
[im 29/64  brain]
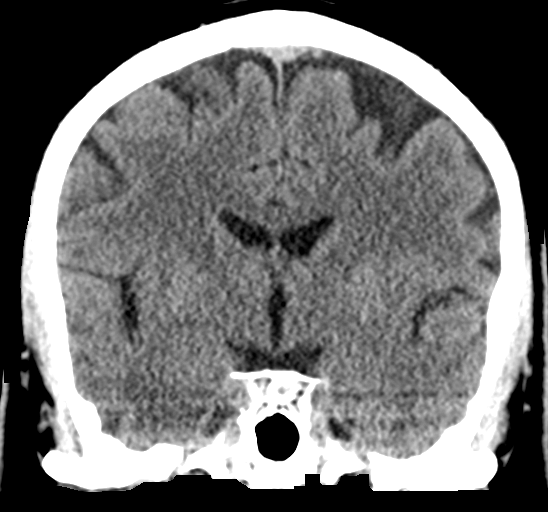
[im 36/64  brain]
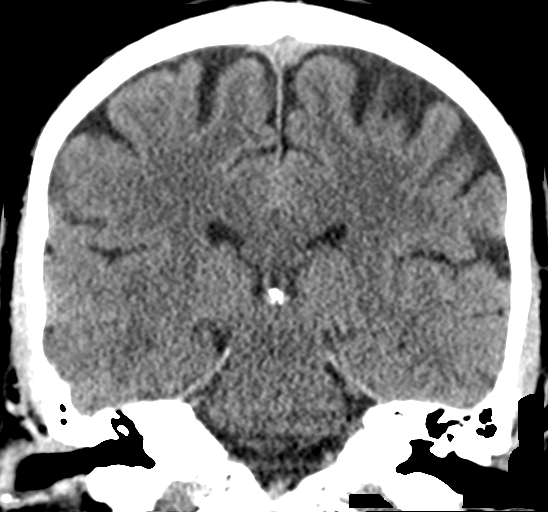

[Series 5: sagittal soft tissue · sagittal · 0.28mm/px · 3 of 52 slices shown]
[im 18/52  brain]
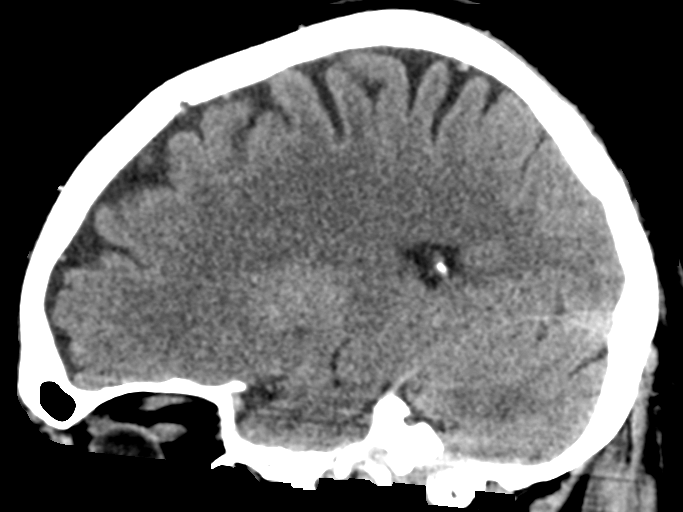
[im 26/52  brain]
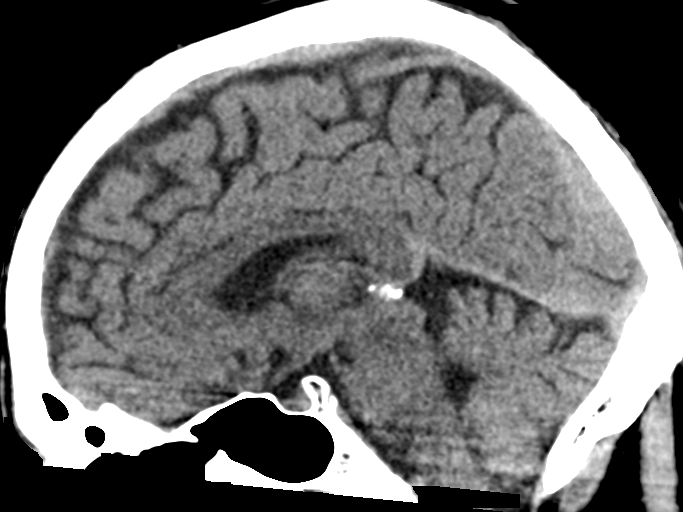
[im 35/52  brain]
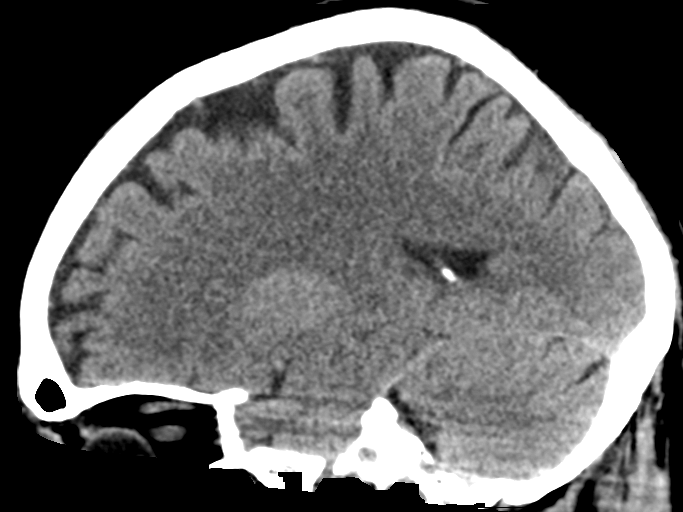

[16 of 47 positions shown; findings below may reference images not displayed]

FINDINGS: Brain: No evidence of acute infarction, hemorrhage, hydrocephalus,
extra-axial collection or mass lesion/mass effect.

Vascular: No hyperdense vessel or unexpected calcification.

Skull: Normal. Negative for fracture or focal lesion.

Sinuses/Orbits: No acute finding.

Other: None.
IMPRESSION: No acute intracranial abnormality identified. Stable unremarkable CT
of the head for age.

## 2019-11-26 ENCOUNTER — Emergency Department: Payer: BC Managed Care – PPO

## 2019-11-26 ENCOUNTER — Emergency Department
Admission: EM | Admit: 2019-11-26 | Discharge: 2019-11-26 | Disposition: A | Discharge status: Home / Self Care | Payer: BC Managed Care – PPO | Attending: Student | Admitting: Student

## 2019-11-26 ENCOUNTER — Other Ambulatory Visit: Payer: Self-pay

## 2019-11-26 ENCOUNTER — Encounter: Payer: Self-pay | Admitting: Emergency Medicine

## 2019-11-26 DIAGNOSIS — I2510 Atherosclerotic heart disease of native coronary artery without angina pectoris: Secondary | ICD-10-CM | POA: Diagnosis not present

## 2019-11-26 DIAGNOSIS — I10 Essential (primary) hypertension: Secondary | ICD-10-CM | POA: Insufficient documentation

## 2019-11-26 DIAGNOSIS — R519 Headache, unspecified: Secondary | ICD-10-CM | POA: Diagnosis present

## 2019-11-26 DIAGNOSIS — Z7982 Long term (current) use of aspirin: Secondary | ICD-10-CM | POA: Diagnosis not present

## 2019-11-26 DIAGNOSIS — Z79899 Other long term (current) drug therapy: Secondary | ICD-10-CM | POA: Diagnosis not present

## 2019-11-26 DIAGNOSIS — Z8673 Personal history of transient ischemic attack (TIA), and cerebral infarction without residual deficits: Secondary | ICD-10-CM | POA: Diagnosis not present

## 2019-11-26 DIAGNOSIS — Z87891 Personal history of nicotine dependence: Secondary | ICD-10-CM | POA: Diagnosis not present

## 2019-11-26 DIAGNOSIS — R42 Dizziness and giddiness: Secondary | ICD-10-CM | POA: Diagnosis not present

## 2019-11-26 DIAGNOSIS — R030 Elevated blood-pressure reading, without diagnosis of hypertension: Secondary | ICD-10-CM

## 2019-11-26 DIAGNOSIS — R079 Chest pain, unspecified: Secondary | ICD-10-CM | POA: Insufficient documentation

## 2019-11-26 DIAGNOSIS — Z951 Presence of aortocoronary bypass graft: Secondary | ICD-10-CM | POA: Diagnosis not present

## 2019-11-26 LAB — BASIC METABOLIC PANEL
Anion gap: 9 (ref 5–15)
BUN: 21 mg/dL (ref 8–23)
CO2: 24 mmol/L (ref 22–32)
Calcium: 9.1 mg/dL (ref 8.9–10.3)
Chloride: 105 mmol/L (ref 98–111)
Creatinine, Ser: 1.26 mg/dL — ABNORMAL HIGH (ref 0.61–1.24)
GFR calc Af Amer: >60 mL/min (ref >60)
GFR calc non Af Amer: 58 mL/min — ABNORMAL LOW (ref >60)
Glucose, Bld: 112 mg/dL — ABNORMAL HIGH (ref 70–99)
Potassium: 4.1 mmol/L (ref 3.5–5.1)
Sodium: 138 mmol/L (ref 135–145)

## 2019-11-26 LAB — CBC
HCT: 45.7 % (ref 39.0–52.0)
Hemoglobin: 15.8 g/dL (ref 13.0–17.0)
MCH: 29.2 pg (ref 26.0–34.0)
MCHC: 34.6 g/dL (ref 30.0–36.0)
MCV: 84.5 fL (ref 80.0–100.0)
Platelets: 220 10*3/uL (ref 150–400)
RBC: 5.41 MIL/uL (ref 4.22–5.81)
RDW: 11.9 % (ref 11.5–15.5)
WBC: 6.5 10*3/uL (ref 4.0–10.5)
nRBC: 0 % (ref 0.0–0.2)

## 2019-11-26 LAB — TROPONIN I (HIGH SENSITIVITY)
Troponin I (High Sensitivity): 5 ng/L (ref <18)
Troponin I (High Sensitivity): 5 ng/L (ref <18)

## 2019-11-26 MED ORDER — IBUPROFEN 400 MG PO TABS
600.0000 mg | ORAL_TABLET | Freq: Once | ORAL | Status: AC
  Administered 2019-11-26: 11:00:00 600 mg via ORAL
  Filled 2019-11-26: qty 2

## 2019-11-26 MED ORDER — DIPHENHYDRAMINE HCL 50 MG/ML IJ SOLN
25.0000 mg | Freq: Once | INTRAMUSCULAR | Status: AC
  Administered 2019-11-26: 25 mg via INTRAVENOUS
  Filled 2019-11-26: qty 1

## 2019-11-26 MED ORDER — SODIUM CHLORIDE 0.9 % IV BOLUS
1000.0000 mL | Freq: Once | INTRAVENOUS | Status: AC
  Administered 2019-11-26: 1000 mL via INTRAVENOUS

## 2019-11-26 MED ORDER — METOCLOPRAMIDE HCL 5 MG/ML IJ SOLN
10.0000 mg | Freq: Once | INTRAMUSCULAR | Status: AC
  Administered 2019-11-26: 10 mg via INTRAVENOUS
  Filled 2019-11-26: qty 2

## 2019-11-26 MED ORDER — ACETAMINOPHEN 500 MG PO TABS
1000.0000 mg | ORAL_TABLET | Freq: Once | ORAL | Status: AC
  Administered 2019-11-26: 11:00:00 1000 mg via ORAL
  Filled 2019-11-26: qty 2

## 2019-11-26 NOTE — ED Provider Notes (Signed)
[facility] Emergency Department Provider Note  ____________________________________________   First MD Initiated Contact with Patient 11/26/19 [phone]     (approximate)  I have reviewed the triage vital signs and the nursing notes.  History  Chief Complaint Headache, Hypertension, Chest Pain, and Dizziness    HPI Todd Collins is a 69 y.o. male PMHx as below who presents to the ED for headache and concern for high BP. Patient states he woke up this morning and noticed a headache. He checked his BP at home and noted it was elevated, which worried him. Presented to [facility] and was referred to ED due to SBP in 190s. He does admit that his BP typically increases when he has increased salt intake, did eat Cheetos yesterday. Reports taking all of his medications as prescribed, including his BP medication which he took this AM.  Headache was not thunderclap or worst at onset, gradually building.  Right sided, described as a dullness.  No radiation, no alleviating or aggravating components.  No associated visual changes, speech difficulties, weakness, numbness, tingling.  No fevers, neck stiffness.   As an aside, he also reports very mild, 1/10, twinges of central chest discomfort.  These have been ongoing for the last month or so and seem to only last for a few minutes to hours before spontaneously resolving.  No associated shortness of breath, nausea, diaphoresis.  By my evaluation he reports he is chest pain-free.   Past Medical Hx Past Medical History:  Diagnosis Date  . CAD (coronary artery disease) 2012   a. s/p 2 vessel CABG (LIMA-LAD, SVG-PDA); b. cath 03/2011: LM 20%, mLAD 75%, pD1 100% s/p PCI/DES, mildly diseaed LCx, mRCA 80%, dRCA 95%, patent grafts  . Depression   . Gout   . Hyperlipidemia   . Hypertension   . Lyme disease     Problem List Patient Active Problem List   Diagnosis Date Noted  . Depression 12/19/2018  . Gout 12/19/2018  . Hx-TIA  (transient ischemic attack) 12/19/2018  . Smoking history 11/05/2016  . Essential hypertension 11/05/2016  . Acute shoulder bursitis, right 08/14/2016  . CAD (coronary artery disease), autologous vein bypass graft 02/14/2016  . Angina pectoris ([place]) 09/08/2015  . Obesity 09/08/2015  . Insomnia 05/25/2015  . 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 Jaw pain 05/06/2014  . 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 History of coronary artery stent placement 05/06/2014  . Hyperlipidemia 05/06/2014  . Statin intolerance 05/06/2014  . Other specified transient cerebral ischemias 05/06/2014  . Pain in the chest 05/06/2014  . S/P angioplasty with stent 01/31/2013    Past Surgical Hx Past Surgical History:  Procedure Laterality Date  . CARDIAC CATHETERIZATION  12/25/2010  . CORONARY ANGIOPLASTY     stent x 1   . CORONARY ARTERY BYPASS GRAFT  11/2010   [facility]  . right index finger surgery      Medications Prior to Admission medications   Medication Sig Start Date End Date Taking? Authorizing Provider  allopurinol (ZYLOPRIM) 300 MG tablet Take 150 mg by mouth daily.    [provider]  aspirin EC 81 MG tablet Take 81 mg by mouth daily.     [provider]  lisinopril (PRINIVIL,ZESTRIL) 40 MG tablet Take 1 tablet (40 mg total) by mouth daily. 12/09/18   Dunn, Areta Haber, PA-C  meclizine (ANTIVERT) 25 MG tablet Take 1 tablet (25 mg total) by mouth 3 (three) times daily as needed for  dizziness or nausea. 12/15/18   Eula Listen, MD  metoprolol tartrate (LOPRESSOR) 50 MG tablet TAKE 1 TABLET(50 MG) BY MOUTH TWICE DAILY 12/09/18   Dunn, Areta Haber, PA-C  nitroGLYCERIN (NITROSTAT) 0.4 MG SL tablet Place 1 tablet (0.4 mg total) under the tongue every 5 (five) minutes as needed for chest pain. 12/09/18   Dunn, Areta Haber, PA-C  Omega-3 Fatty Acids (FISH OIL) 1000 MG CAPS Take by mouth 2 (two) times daily.    [provider]  ondansetron (ZOFRAN ODT) 4 MG disintegrating  tablet Take 1 tablet (4 mg total) by mouth every 8 (eight) hours as needed. 12/15/18   Eula Listen, MD    Allergies Bupropion, Omeprazole, Prednisone, Trazodone, and Trazodone and nefazodone  Family Hx Family History  Problem Relation Age of Onset  . Heart attack Father 77    Social Hx Social History   Tobacco Use  . Smoking status: Former Smoker    Types: Cigarettes    Quit date: 12/03/1977    Years since quitting: 42.0  . Smokeless tobacco: Never Used  Substance Use Topics  . Alcohol use: Yes    Alcohol/week: 2.0 standard drinks    Types: 2 Cans of beer per week    Comment: daily  . Drug use: No     Review of Systems  Constitutional: Negative for fever, chills. Eyes: Negative for visual changes. ENT: Negative for sore throat. Cardiovascular: + for chest pain. Respiratory: Negative for shortness of breath. Gastrointestinal: Negative for nausea, vomiting.  Genitourinary: Negative for dysuria. Musculoskeletal: Negative for leg swelling. Skin: Negative for rash. Neurological: + for headaches.   Physical Exam  Vital Signs: ED Triage Vitals  Enc Vitals Group     BP 11/26/19 0819 (!) 203/88     Pulse Rate 11/26/19 0819 (!) 56     Resp 11/26/19 0819 18     Temp 11/26/19 0819 98.2 F (36.8 C)     Temp Source 11/26/19 0819 Oral     SpO2 11/26/19 0819 98 %     Weight 11/26/19 0807 209 lb (94.8 kg)     Height 11/26/19 0807 5\' 8"  (1.727 m)     Head Circumference --      Peak Flow --      Pain Score 11/26/19 0807 1     Pain Loc --      Pain Edu? --      Excl. in [place]? --     Constitutional: Alert and oriented.  Head: Normocephalic. Atraumatic. Eyes: Conjunctivae clear. Sclera anicteric. Nose: No congestion. No rhinorrhea. Mouth/Throat: Wearing mask.  Neck: No stridor.   Cardiovascular: Normal rate, regular rhythm. Extremities well perfused. Respiratory: Normal respiratory effort.  Lungs CTAB. Gastrointestinal: Soft. Non-tender. Non-distended.    Musculoskeletal: No lower extremity edema. No deformities. Neurologic:  Normal speech and language. No gross focal neurologic deficits are appreciated. Alert and oriented.  Face symmetric.  Tongue midline.  Cranial nerves II through XII intact. UE and LE strength 5/5 and symmetric. UE and LE SILT.  Skin: Skin is warm, dry and intact. No rash noted. Psychiatric: Mood and affect are appropriate for situation.  EKG  Personally reviewed.   Rate: 59 Rhythm: sinus Axis: normal Intervals: PR 224 ms First-degree AV block, no acute ischemic changes No STEMI    Radiology  CXR: IMPRESSION:  No acute cardiopulmonary findings.   CT head: IMPRESSION:  Negative head CT.    Procedures  Procedure(s) performed (including critical care):  Procedures   Initial Impression /  Assessment and Plan / ED Course  69 y.o. male who presents to the ED for headache, as well as concern for elevated blood pressure.  Ddx: tension headache, migraine headache.  No fevers or neck stiffness suggestive of meningitis.  Not worst of life, or thunderclap in description, do not suspect SAH.  No associated visual symptoms, no temporal artery tenderness concerning for GCA.  No associated neurological symptoms.  Suspect patient's headache/discomfort is more likely the etiology of his elevated blood pressure.  He has no confusion or other associated neurological signs or symptoms on exam to suggest hypertensive emergency.  Work-up initiated in triage largely unremarkable, including initial troponin negative.  CT head negative.  EKG without acute ischemic changes.  Plan for symptomatic treatment of headache and reassess.  Headache and blood pressure both improved after migraine cocktail, repeat blood pressure 139/76. Repeat troponin negative. Patient states he is asymptomatic at this time. As such, given negative work-up and improvement in symptoms and blood pressure will proceed with discharge.  Advised outpatient  follow-up and given return precautions.  Patient voices understanding and is comfortable to plan and discharge.   Final Clinical Impression(s) / ED Diagnosis  Final diagnoses:  Complaint of headache  Elevated blood pressure reading       Note:  This document was prepared using Dragon voice recognition software and may include unintentional dictation errors.   Lilia Pro., MD 11/26/19 [phone]

## 2019-11-26 NOTE — ED Triage Notes (Signed)
Pt reports woke up this am with a HA. Pt reports checked his BP and it was elevated so went to hs MD. Pt states his MD at [facility] brought him over here for elevated BP. Pt BP at [facility] was in the [id number] systolic.

## 2019-11-26 NOTE — ED Notes (Signed)
Signature pad not working, pt verbalizes understanding of d/c instructions. No further questions or concerns at this time

## 2019-11-26 NOTE — Discharge Instructions (Signed)
Thank you for letting us take care of you in the emergency department today.  ° °Please continue to take any regular, prescribed medications.  ° °Please follow up with: °- Your primary care doctor to review your ER visit and follow up on your symptoms.  ° °Please return to the ER for any new or worsening symptoms.  ° °

## 2019-11-27 ENCOUNTER — Telehealth: Payer: Self-pay | Admitting: Cardiovascular Disease

## 2019-11-27 MED ORDER — AMLODIPINE BESYLATE 5 MG PO TABS: 5.0000 mg | ORAL_TABLET | Freq: Every day | ORAL | 3 refills | Status: DC

## 2019-11-27 MED ORDER — NITROGLYCERIN 0.4 MG SL SUBL: 0.4000 mg | SUBLINGUAL_TABLET | SUBLINGUAL | 0 refills | Status: DC | PRN

## 2019-11-27 NOTE — Telephone Encounter (Signed)
Spoke with patient and confirmed he is taking metoprolol 50 mg twice daily and lisinopril 40 mg once daily. Reviewed that I am sending in amlodipine 5 mg once daily and to bring list of blood pressures on Monday to his appointment. He verbalized understanding with no further questions at this time.

## 2019-11-27 NOTE — Telephone Encounter (Signed)
Pt c/o BP issue: STAT if pt c/o blurred vision, one-sided weakness or slurred speech  1. What are your last 5 BP readings? Cannot remember exact readings ~ 200/100 ~ 196/93 or 98  2. Are you having any other symptoms (ex. Dizziness, headache, blurred vision, passed out)? Went to ED for headache  3. What is your BP issue? Patient's BP has been getting high.  Would like to know if there can be any changes made to medications to help.  Please call to discuss.

## 2019-11-27 NOTE — Telephone Encounter (Signed)
Patient returning call for med recommendations

## 2019-11-27 NOTE — Telephone Encounter (Signed)
Can we make sure he has refills on his lisinopril and metoprolol In addition would start amlodipine 5 mg daily We can further adjust on Monday at his appointment

## 2019-11-27 NOTE — Telephone Encounter (Signed)
Left voicemail message to call back  

## 2019-11-27 NOTE — Telephone Encounter (Signed)
Spoke with patient and reviewed blood pressures and medications. He was seen last year 11/2018 and not since then. Reviewed importance of checking blood pressures 2 hours after medications and to keep log and bring in on Monday to see provider here in the office. Advised that if blood pressures persist high 200's/100's that he could take one nitro tablet which can help get that pressure down. Scheduled him to see Dr. Rockey Situ at 3:20 PM on Monday and confirmed information with him. He verbalized understanding of our conversation, agreement with plan, and had no further questions at this time.

## 2019-11-28 NOTE — Progress Notes (Signed)
Cardiology Office Note  Date:  11/30/2019   ID:  COURTEZ MAYERNIK, DOB Mar 06, 1951, MRN [id number]  PCP:  Kirk Ruths, MD   Chief Complaint  Patient presents with  . office visit    Pt elevated BP. Pt is considering are they any meds that can help with BP. Meds verbally reviewed w/ pt.    HPI:  Mr. Shupe is a 69 year old gentleman  with history of  coronary artery disease,  catheterization in March 2012 showing 50% mid LAD disease, 90% disease of a small diagonal, 50% proximal RCA disease, 70% mid RCA disease, 60-70% distal RCA disease, represented several months later  CABG x2 vessel, LIMA to the LAD, vein graft to the PDA 02/02/2011  stent  depression, significant statin intolerance Last myoview 2017 who presents for routine followup of his coronary artery disease and hypertension  In the ER 11/2019, headache and concern for high BP Cardiac work-up negative  Lost brother to covid 2 weeks, [facility] Younger brother Stressed since then Could be pushing blood pressure higher Over the past week or so BP 150 to 180 at home on medications Over the past 4 5 days has been on amlodipine 5 No significant improvement in pressures   drives a truck, part-time Does not want statin, caused muscle pain, shoulders  Echo 03/2019 left ventricle has normal systolic function, with an ejection  fraction of 55-60%. Mild-moderate stenosis of the aortic valve.   EKG personally reviewed by myself on todays visit Shows normal sinus rhythm rate 81 bpm no significant ST or T-wave changes  Other past medical history reviewed Previous vision issue"squiggly in both eyes", "like a kalidescope" Lasted 1 hour No further episodes, back to normal now, no other neurologic issues  previous episodes of jaw pain was from eating salty food, drinking alcohol. Water seemed to make his symptoms better. Symptoms not associated with exertion. 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 In the past reported ranexa caused side effects (uination issues)  Reports having a TIA in October 2014, blurry vision, were findings difficulty. CT scan of the head was normal. Echocardiogram and Holter performed. Holter showed normal sinus rhythm  Echocardiogram June 2012 showing ejection fraction 45% Echocardiogram October 2014 showing normal LV systolic function,  Stress test June 2012 showing mild inferior wall ischemia  Carotid ultrasound June 2012 showing no hemodynamically significant stenosis   PMH:   has a past medical history of CAD (coronary artery disease) (2012), Depression, Gout, Hyperlipidemia, Hypertension, and Lyme disease.  PSH:    Past Surgical History:  Procedure Laterality Date  . CARDIAC CATHETERIZATION  12/25/2010  . CORONARY ANGIOPLASTY     stent x 1   . CORONARY ARTERY BYPASS GRAFT  11/2010   [facility]  . right index finger surgery      Current Outpatient Medications  Medication Sig Dispense Refill  . amLODipine (NORVASC) 5 MG tablet Take 1 tablet (5 mg total) by mouth daily. 180 tablet 3  . aspirin EC 81 MG tablet Take 81 mg by mouth daily.     Marland Kitchen lisinopril (PRINIVIL,ZESTRIL) 40 MG tablet Take 1 tablet (40 mg total) by mouth daily. 90 tablet 3  . metoprolol tartrate (LOPRESSOR) 50 MG tablet TAKE 1 TABLET(50 MG) BY MOUTH TWICE DAILY 180 tablet 3  . nitroGLYCERIN (NITROSTAT) 0.4 MG SL tablet Place 1 tablet (0.4 mg total) under the tongue every 5 (five) minutes as needed for chest pain. 25 tablet 0  .  Omega-3 Fatty Acids (FISH OIL) 1000 MG CAPS Take by mouth 2 (two) times daily.     No current facility-administered medications for this visit.    Allergies:   Bupropion, Omeprazole, Prednisone, Trazodone, and Trazodone and nefazodone   Social History:  The patient  reports that he quit smoking about 42 years ago. His smoking use included cigarettes. He has never used smokeless tobacco. He reports current alcohol use of about 2.0 standard  drinks of alcohol per week. He reports that he does not use drugs.   Family History:   family history includes Heart attack (age of onset: 34) in his father.    Review of Systems: Review of Systems  Constitutional: Negative.   HENT: Negative.   Respiratory: Negative.   Cardiovascular: Negative.   Gastrointestinal: Negative.   Musculoskeletal: Negative.   Neurological: Negative.   Psychiatric/Behavioral: Negative.   All other systems reviewed and are negative.    PHYSICAL EXAM: VS:  BP (!) 142/82 (BP Location: Left Arm, Patient Position: Sitting, Cuff Size: Large)   Pulse 81   Ht 5' 8.5" (1.74 m)   Wt 224 lb (101.6 kg)   SpO2 96%   BMI 33.56 kg/m  , BMI Body mass index is 33.56 kg/m. Constitutional:  oriented to person, place, and time. No distress.  HENT:  Head: Grossly normal Eyes:  no discharge. No scleral icterus.  Neck: No JVD, no carotid bruits  Cardiovascular: Regular rate and rhythm, 2/6 SEM RSB,  Pulmonary/Chest: Clear to auscultation bilaterally, no wheezes or rails Abdominal: Soft.  no distension.  no tenderness.  Musculoskeletal: Normal range of motion Neurological:  normal muscle tone. Coordination normal. No atrophy Skin: Skin warm and dry Psychiatric: normal affect, pleasant   Recent Labs: 12/15/2018: ALT 20 11/26/2019: BUN 21; Creatinine, Ser 1.26; Hemoglobin 15.8; Platelets 220; Potassium 4.1; Sodium 138    Lipid Panel Lab Results  Component Value Date   CHOL 267 (H) 12/09/2018   HDL 39 (L) 12/09/2018   LDLCALC 176 (H) 12/09/2018   TRIG 258 (H) 12/09/2018      Wt Readings from Last 3 Encounters:  11/30/19 224 lb (101.6 kg)  11/26/19 209 lb (94.8 kg)  12/19/18 209 lb (94.8 kg)      ASSESSMENT AND PLAN:  Coronary artery disease involving coronary bypass graft with angina pectoris, unspecified whether native or transplanted heart (HCC) Currently with no symptoms of angina. No further workup at this time. Continue current medication  regimen.  Mixed hyperlipidemia He does not want a statin at this time,  Talked about repatha and not interested at this time praluent.  Will start zetia 10 mg daily  Essential hypertension lisinopril 40 mg daily, metoprolol 50 mgrams twice a day Blood pressure higher, unclear if this is from stress from recent loss of his brother 2 weeks ago from [facility]id at [facility] Increase amlodipine up to 10 mg daily He will talk about anxiety/adjustment disorder medications with Dr. Ouida Sills which may be playing a role in his high blood pressure  S/P CABG x 2 Denies having any anginal symptoms No further ischemic work-up at this time   shortness of breath Noted on prior office visit, Recommend walking program for conditioning    Total encounter time more than 25 minutes  Greater than 50% was spent in counseling and coordination of care with the patient   Disposition:   F/U  6 months   No orders of the defined types were placed in this encounter.    Signed, Tim  Rockey Situ, M.D., Ph.D. 11/30/2019  [place], [place]

## 2019-11-30 ENCOUNTER — Other Ambulatory Visit: Payer: Self-pay

## 2019-11-30 ENCOUNTER — Ambulatory Visit (INDEPENDENT_AMBULATORY_CARE_PROVIDER_SITE_OTHER): Payer: BC Managed Care – PPO | Admitting: Cardiovascular Disease

## 2019-11-30 ENCOUNTER — Encounter: Payer: Self-pay | Admitting: Cardiovascular Disease

## 2019-11-30 VITALS — BP 142/82 | HR 81 | Ht 68.5 in | Wt 224.0 lb

## 2019-11-30 DIAGNOSIS — I350 Nonrheumatic aortic (valve) stenosis: Secondary | ICD-10-CM

## 2019-11-30 DIAGNOSIS — R0600 Dyspnea, unspecified: Secondary | ICD-10-CM

## 2019-11-30 DIAGNOSIS — I10 Essential (primary) hypertension: Secondary | ICD-10-CM

## 2019-11-30 DIAGNOSIS — I25119 Atherosclerotic heart disease of native coronary artery with unspecified angina pectoris: Secondary | ICD-10-CM

## 2019-11-30 DIAGNOSIS — E785 Hyperlipidemia, unspecified: Secondary | ICD-10-CM | POA: Diagnosis not present

## 2019-11-30 MED ORDER — AMLODIPINE BESYLATE 10 MG PO TABS: 10.0000 mg | ORAL_TABLET | Freq: Every day | ORAL | 3 refills | Status: DC

## 2019-11-30 MED ORDER — EZETIMIBE 10 MG PO TABS: 10.0000 mg | ORAL_TABLET | Freq: Every day | ORAL | 3 refills | Status: DC

## 2019-11-30 NOTE — Patient Instructions (Addendum)
Medication Instructions:  Please increase the amlodipine up to 10 mg daily, for blood pressure  Please start zetia one a day for cholesterol    If you need a refill on your cardiac medications before your next appointment, please call your pharmacy.    Lab work: No new labs needed   If you have labs (blood work) drawn today and your tests are completely normal, you will receive your results only by: Marland Kitchen MyChart Message (if you have MyChart) OR . A paper copy in the mail If you have any lab test that is abnormal or we need to change your treatment, we will call you to review the results.   Testing/Procedures: No new testing needed   Follow-Up: At [facility], you and your health needs are our priority.  As part of our continuing mission to provide you with exceptional heart care, we have created designated Provider Care Teams.  These Care Teams include your primary Cardiologist (physician) and Advanced Practice Providers (APPs -  Physician Assistants and Nurse Practitioners) who all work together to provide you with the care you need, when you need it  . You will need a follow up appointment in 6 months   . Providers on your designated Care Team:   . Murray Hodgkins, NP . Christell Faith, PA-C . Marrianne Mood, PA-C  Any Other Special Instructions Will Be Listed Below (If Applicable).  For educational health videos Log in to : [email or web address] Or : [email or web address], password : [email or web address]

## 2019-12-06 ENCOUNTER — Other Ambulatory Visit: Payer: Self-pay | Admitting: Cardiovascular Disease

## 2019-12-15 ENCOUNTER — Other Ambulatory Visit: Payer: Self-pay | Admitting: Physician Assistant

## 2019-12-21 ENCOUNTER — Other Ambulatory Visit: Payer: Self-pay

## 2019-12-21 MED ORDER — NITROGLYCERIN 0.4 MG SL SUBL: 0.4000 mg | SUBLINGUAL_TABLET | SUBLINGUAL | 0 refills | Status: DC | PRN

## 2020-04-07 ENCOUNTER — Ambulatory Visit (INDEPENDENT_AMBULATORY_CARE_PROVIDER_SITE_OTHER): Payer: BC Managed Care – PPO

## 2020-04-07 ENCOUNTER — Other Ambulatory Visit: Payer: Self-pay

## 2020-04-07 DIAGNOSIS — I350 Nonrheumatic aortic (valve) stenosis: Secondary | ICD-10-CM

## 2020-04-07 MED ORDER — PERFLUTREN LIPID MICROSPHERE
1.0000 mL | INTRAVENOUS | Status: AC | PRN
  Administered 2020-04-07: 2 mL via INTRAVENOUS

## 2020-04-07 NOTE — Progress Notes (Unsigned)
   Patient ID: Todd Collins, male    DOB: 05/26/1951, 68 y.o.   MRN: [id number]  As part of an echocardiogram this morning, Definity was administered to aid LV opacification. The patient began complaining of intense low back pain within seconds of injection. The pain largely resolved after 30-45 seconds. The patient was observed in the exam lab for 15 minutes following onset of symptoms and 15 minutes in the office lobby. He decided to leave but still had very mild discomfort in his lower back. [facility] was contacted and the presumed reaction reported. It is on record at [facility] under case # [id number]     Review of Systems    Physical Exam

## 2020-04-08 ENCOUNTER — Telehealth: Payer: Self-pay

## 2020-04-08 NOTE — Telephone Encounter (Signed)
Call to patient to review echo results.    Pt verbalized understanding and has no further questions at this time.    Advised pt to call for any further questions or concerns.  No further orders.   

## 2020-04-08 NOTE — Telephone Encounter (Signed)
-----   Message from Theora Gianotti, NP sent at 04/08/2020 12:14 PM EDT ----- Nl heart squeezing fxn with moderate stiffness of the heart.  Mild mitral valve leakiness and very mild stenosis (tightness) of the aortic valve. Re: stiffness of heart muscle, we try and improve HR and BP.  Both were fine at an office visit in March.

## 2020-06-29 ENCOUNTER — Other Ambulatory Visit: Payer: Self-pay

## 2020-06-29 ENCOUNTER — Other Ambulatory Visit: Payer: BC Managed Care – PPO

## 2020-06-29 DIAGNOSIS — Z20822 Contact with and (suspected) exposure to covid-19: Secondary | ICD-10-CM

## 2020-07-01 LAB — SARS-COV-2, NAA 2 DAY TAT

## 2020-07-01 LAB — NOVEL CORONAVIRUS, NAA: SARS-CoV-2, NAA: NOT DETECTED

## 2020-07-04 ENCOUNTER — Other Ambulatory Visit: Payer: BC Managed Care – PPO

## 2020-07-04 ENCOUNTER — Other Ambulatory Visit: Payer: Self-pay | Admitting: Critical Care Medicine

## 2020-07-04 DIAGNOSIS — Z20822 Contact with and (suspected) exposure to covid-19: Secondary | ICD-10-CM

## 2020-07-05 LAB — SARS-COV-2, NAA 2 DAY TAT

## 2020-07-05 LAB — NOVEL CORONAVIRUS, NAA: SARS-CoV-2, NAA: NOT DETECTED

## 2020-07-06 ENCOUNTER — Other Ambulatory Visit: Payer: BC Managed Care – PPO

## 2020-07-13 ENCOUNTER — Other Ambulatory Visit: Payer: BC Managed Care – PPO

## 2020-07-13 DIAGNOSIS — Z20822 Contact with and (suspected) exposure to covid-19: Secondary | ICD-10-CM

## 2020-07-15 LAB — SARS-COV-2, NAA 2 DAY TAT

## 2020-07-15 LAB — NOVEL CORONAVIRUS, NAA: SARS-CoV-2, NAA: NOT DETECTED

## 2020-08-17 ENCOUNTER — Ambulatory Visit: Payer: BC Managed Care – PPO | Admitting: Family

## 2020-08-17 ENCOUNTER — Encounter: Payer: Self-pay | Admitting: Family

## 2020-08-17 ENCOUNTER — Other Ambulatory Visit: Payer: Self-pay

## 2020-08-17 VITALS — BP 156/72 | HR 60 | Ht 68.5 in | Wt 230.0 lb

## 2020-08-17 DIAGNOSIS — E782 Mixed hyperlipidemia: Secondary | ICD-10-CM | POA: Diagnosis not present

## 2020-08-17 DIAGNOSIS — I350 Nonrheumatic aortic (valve) stenosis: Secondary | ICD-10-CM

## 2020-08-17 DIAGNOSIS — I25119 Atherosclerotic heart disease of native coronary artery with unspecified angina pectoris: Secondary | ICD-10-CM | POA: Diagnosis not present

## 2020-08-17 DIAGNOSIS — Z7189 Other specified counseling: Secondary | ICD-10-CM

## 2020-08-17 DIAGNOSIS — E785 Hyperlipidemia, unspecified: Secondary | ICD-10-CM

## 2020-08-17 DIAGNOSIS — I10 Essential (primary) hypertension: Secondary | ICD-10-CM

## 2020-08-17 MED ORDER — EZETIMIBE 10 MG PO TABS: 10.0000 mg | ORAL_TABLET | Freq: Every day | ORAL | 3 refills | Status: DC

## 2020-08-17 NOTE — Progress Notes (Signed)
Office Visit    Patient Name: Todd Collins Date of Encounter: 08/17/2020  Primary Care Provider:  Kirk Ruths, MD Primary Cardiologist:  Ida Rogue, MD Electrophysiologist:  None   Chief Complaint    Todd Collins is a 69 y.o. male with a hx of CAD with MI in 2012 and subsequent two-vessel CABG with LIMA-LAD and SVG-PDA in 2012 s/p subsequent PCI/DES to occluded D1 03/2011, possible TIA, HTN, HLD with statin intolerance, GERD, gout, steroid-induced hyperglycemia presents today for follow-up of CAD  Past Medical History    Past Medical History:  Diagnosis Date  . CAD (coronary artery disease) 2012   a. s/p 2 vessel CABG (LIMA-LAD, SVG-PDA); b. cath 03/2011: LM 20%, mLAD 75%, pD1 100% s/p PCI/DES, mildly diseaed LCx, mRCA 80%, dRCA 95%, patent grafts  . Depression   . Gout   . Hyperlipidemia   . Hypertension   . Lyme disease    Past Surgical History:  Procedure Laterality Date  . CARDIAC CATHETERIZATION  12/25/2010  . CORONARY ANGIOPLASTY     stent x 1   . CORONARY ARTERY BYPASS GRAFT  11/2010   [facility]  . right index finger surgery      Allergies  Allergies  Allergen Reactions  . Definity [Perflutren Lipid Microsphere] Other (See Comments)    Patient experiences severe low back pain for 30-45 seconds after injection  . Bupropion Other (See Comments)    Other reaction(s): RASH Upper extremity edema Other reaction(s): RASH Upper extremity edema Other reaction(s): RASH  . Omeprazole Other (See Comments)    Other reaction(s): Other (See Comments) Caused muscle cramps Other reaction(s): Other (See Comments) Caused muscle cramps  . Prednisone Other (See Comments)    Elevated blood sugar  Other reaction(s): SHORTNESS OF BREATH Elevated blood sugars Elevated blood sugar Other reaction(s): SHORTNESS OF BREATH  . Statins     Other reaction(s): Muscle Pain  . Trazodone Other (See Comments)    Shortness of breath  wheezing  . Trazodone And  Nefazodone     Shortness of breath  wheezing    History of Present Illness    Todd Collins is a 69 y.o. male with a hx of CAD with MI in 2012 and subsequent two-vessel CABG with LIMA-LAD and SVG-PDA in 2012 s/p subsequent PCI/DES to occluded D1 03/2011, possible TIA, HTN, HLD with statin intolerance, GERD, gout, steroid-induced hyperglycemia. He was last seen 11/30/19 by Dr. Rockey Situ.  Previously followed by [facility] cardiology.  Carotid duplex 2012with no hemodynamically significant stenosis.  Cath 12/2010 50% mid LAD disease, 90% small diagonal disease, 50% proximal RCA disease, 60-70% distal RCA disease.  Echo 01/18/2011 EF 60-65.  Two-vessel CABG in 2012.  Spring 2012 recurrent chest pain with stress test 02/2011 with ST depressions greater than 1 mm during end of study but no symptoms.  Follow-up cardiac cath 03/1111 with 20% lesion in the distal left main, diffusely diseased mid LAD up to 75% 100% proximal occlusion the first diagonal with subsequent PCI/DES, mildly diseased LCx and 80% stenosis of mid RCA and a 95% stenosis in distal RCA.  LIMA to LAD and SVG to PDA were patent.  Repeat stress test 2013 with no evidence of ischemia or scar and EF greater than 65%.  He had prior TIA-like symptoms 06/2013 with head CT normal.  At that time echo, Holter unremarkable.  Nuclear stress test 12/2013 with no ischemia with 1 mm ST depression in leads V5 and V6 concerning for ischemia at peak  rest.  EF 69%.  Overall low risk scan.  Echocardiogram 04/07/2020 LVEF 60 to 69%, grade 2 diastolic dysfunction, no RWMA, RV normal size and function, mild MR, very mild aortic valve stenosis with valve area 1.86 cm, mean gradient 10.0.  Presents today for follow up. He was diagnosed with COVID 07/19/20 and received MAB. Reports some low BP during COVID but over the last week has been elevated. Tells me he previously had chest pain with Amlodipine but has not been taking as prescribed. He did not start the Zetia for cholesterol  management as recommended 11/2019.  Walking 1 mile per day for exercise without dyspnea. Feels as if he is gaining fluid. Notices abdominal swelling but no LE edema. Has been eating some high salt popcorn. Plans to reduce sodium intake.  Reports he has been dropping things for about a week. Tells me "things just slip out of my hand". Reports his hands are not numb. Occurs with both hands. For example, dropped a bottle this morning which broke.   Reports 3-4 episodes of vision changes in the past three weeks. He will have 'red, white, and blue lightening bolt' or kaleidoscope sensation. Tells me he just can't see clearly but that these episodes have been going on for a long time and he's previously had them worked up.   EKGs/Labs/Other Studies Reviewed:   The following studies were reviewed today:  EKG:  EKG is ordered today.  The ekg ordered today demonstrates NSR 60 bpm with possible LA enlargement and no acute ST/T wave changes.   Recent Labs: 11/26/2019: BUN 21; Creatinine, Ser 1.26; Hemoglobin 15.8; Platelets 220; Potassium 4.1; Sodium 138  Recent Lipid Panel    Component Value Date/Time   CHOL 267 (H) 12/09/2018 1349   TRIG 258 (H) 12/09/2018 1349   HDL 39 (L) 12/09/2018 1349   CHOLHDL 6.8 (H) 12/09/2018 1349   LDLCALC 176 (H) 12/09/2018 1349   LDLDIRECT 200 (H) 12/09/2018 1349    Home Medications   Current Meds  Medication Sig  . allopurinol (ZYLOPRIM) 150 mg TABS tablet Take 150 mg by mouth daily.  Marland Kitchen aspirin EC 81 MG tablet Take 81 mg by mouth daily.   Marland Kitchen Bioflavonoid Products (VITAMIN C PLUS PO) Take by mouth daily.  Marland Kitchen lisinopril (ZESTRIL) 40 MG tablet TAKE 1 TABLET(40 MG) BY MOUTH DAILY  . Magnesium Cl-Calcium Carbonate (SLOW-MAG PO) Take 2 tablets by mouth daily.  . metoprolol tartrate (LOPRESSOR) 50 MG tablet TAKE 1 TABLET(50 MG) BY MOUTH TWICE DAILY  . nitroGLYCERIN (NITROSTAT) 0.4 MG SL tablet Place 1 tablet (0.4 mg total) under the tongue every 5 (five) minutes as  needed for chest pain.  . Omega-3 Fatty Acids (FISH OIL) 1000 MG CAPS Take by mouth 2 (two) times daily.  Marland Kitchen VITAMIN D PO Take 2,000 Int'l Units by mouth daily.     Review of Systems  All other systems reviewed and are otherwise negative except as noted above.  Physical Exam    VS:  BP (!) 156/72 (BP Location: Left Arm, Patient Position: Sitting, Cuff Size: Large)   Pulse 60   Ht 5' 8.5" (1.74 m)   Wt 230 lb (104.3 kg)   SpO2 98%   BMI 34.46 kg/m  , BMI Body mass index is 34.46 kg/m.  Wt Readings from Last 3 Encounters:  08/17/20 230 lb (104.3 kg)  11/30/19 224 lb (101.6 kg)  11/26/19 209 lb (94.8 kg)    GEN: Well nourished, well developed, in no acute distress.  HEENT: normal. Neck: Supple, no JVD, carotid bruits, or masses. Cardiac: RRR, no murmurs, rubs, or gallops. No clubbing, cyanosis, edema.  Radials/DP/PT 2+ and equal bilaterally.  Respiratory:  Respirations regular and unlabored, clear to auscultation bilaterally. GI: Soft, nontender, nondistended. MS: No deformity or atrophy. Skin: Warm and dry, no rash. Neuro:  Strength and sensation are intact. Psych: Normal affect.  Assessment & Plan    1. CAD s/p CABG and subsequent PCI of native D1 - Reports no anginal symptoms. No indication for ischemic evaluation at this time. GDMT includes aspirin, beta blocker. No statin secondary to previous intolerance. Continue low salt, heart healthy diet and regular cardiovascular exercise.   2. S/p COVID 19 - Diagnosed 07/19/20. Did not require hospitalization. He did receive MAB infusion 07/20/20. Feels his breathing is back to baseline and all symptoms have resolved.   3. HLD, LDL goal less than 70- Not interested in statin due to previous intolerance. Declines Praluent/Repatha. Previously recommended for Zetia 11/2019 but did not start, he is agreeable to start Zetia 10mg  daily today. Plan for repeat labs at follow up.   4. Murmur / Mild aortic stenosis - Very mild aortic stenosis by  echo 03/2020. No lightheadedness, dyspnea, chest pain. Continue to monitor with periodic echo. Continue optimal BP and HR control.   5. HTN - Initial BP in clinic 190/100. Repeat BP without intervention 156/72. He declines changes to antihypertensive regimen. He has not been taking Amlodipine as tells me it caused "chest pain". Reports previous intolerance to HCTZ but cannot recall side effect. Check in on BP with MyChart message in 1 week. If BP remains elevated consider transition from Lopressor to [place].  6. One week history of "dropping things" - Neurological exam today unremarkable. Strength equal bilaterally. Encouraged to call PCP to discuss.  Disposition: Follow up in 2 month(s) with Dr. Rockey Situ or APP  Signed, Loel Dubonnet, NP 08/17/2020, 12:48 PM [place]

## 2020-08-17 NOTE — Patient Instructions (Signed)
Medication Instructions:  Your physician has recommended you make the following change in your medication:   START Ezetimibe (Zetia) 10mg  daily  *If you need a refill on your cardiac medications before your next appointment, please call your pharmacy*  Lab Work: No lab work today.   Testing/Procedures: Your EKG today shows normal sinus rhythm which is a great result.   Follow-Up: At [facility], you and your health needs are our priority.  As part of our continuing mission to provide you with exceptional heart care, we have created designated Provider Care Teams.  These Care Teams include your primary Cardiologist (physician) and Advanced Practice Providers (APPs -  Physician Assistants and Nurse Practitioners) who all work together to provide you with the care you need, when you need it.  We recommend signing up for the patient portal called "MyChart".  Sign up information is provided on this After Visit Summary.  MyChart is used to connect with patients for Virtual Visits (Telemedicine).  Patients are able to view lab/test results, encounter notes, upcoming appointments, etc.  Non-urgent messages can be sent to your provider as well.   To learn more about what you can do with MyChart, go to [email or web address].    Your next appointment:   2 month(s)  The format for your next appointment:   In Person  Provider:   You may see Ida Rogue, MD or one of the following Advanced Practice Providers on your designated Care Team:    Murray Hodgkins, NP  Christell Faith, PA-C  Marrianne Mood, PA-C  Laurann Montana, NP  Cadence Kathlen Mody, [place]  Other Instructions  Check your blood pressure once per day at home and keep a log. If your blood pressure is consistently more than 130/80, please call us to let us know.   Tips to Measure your Blood Pressure Correctly  To determine whether you have hypertension, a medical professional will take a blood pressure reading. How you prepare  for the test, the position of your arm, and other factors can change a blood pressure reading by 10% or more. That could be enough to hide high blood pressure, start you on a drug you don't really need, or lead your doctor to incorrectly adjust your medications.  National and international guidelines offer specific instructions for measuring blood pressure. If a doctor, nurse, or medical assistant isn't doing it right, don't hesitate to ask him or her to get with the guidelines.  Here's what you can do to ensure a correct reading: . Don't drink a caffeinated beverage or smoke during the 30 minutes before the test. . Sit quietly for five minutes before the test begins. . During the measurement, sit in a chair with your feet on the floor and your arm supported so your elbow is at about heart level. . The inflatable part of the cuff should completely cover at least 80% of your upper arm, and the cuff should be placed on bare skin, not over a shirt. . Don't talk during the measurement. . Have your blood pressure measured twice, with a brief break in between. If the readings are different by 5 points or more, have it done a third time.  Because blood pressure varies throughout the day, your doctor will rarely diagnose hypertension on the basis of a single reading. Instead, he or she will want to confirm the measurements on at least two occasions, usually within a few weeks of one another. The exception to this rule is if you have a blood  pressure reading of 180/110 mm Hg or higher. A result this high usually calls for prompt treatment.  In 2017, new guidelines from the [place], the [employer] of Cardiology, and nine other health organizations lowered the diagnosis of high blood pressure to 130/80 mm Hg or higher for all adults. The guidelines also redefined the various blood pressure categories to now include normal, elevated, Stage 1 hypertension, Stage 2 hypertension, and  hypertensive crisis (see "Blood pressure categories").  Blood pressure categories  Blood pressure category SYSTOLIC (upper number)  DIASTOLIC (lower number)  Normal Less than 120 mm Hg and Less than 80 mm Hg  Elevated 120-129 mm Hg and Less than 80 mm Hg  High blood pressure: Stage 1 hypertension 130-139 mm Hg or 80-89 mm Hg  High blood pressure: Stage 2 hypertension 140 mm Hg or higher or 90 mm Hg or higher  Hypertensive crisis (consult your doctor immediately) Higher than 180 mm Hg and/or Higher than 120 mm Hg  Source: American Heart Association and American Stroke Association. For more on getting your blood pressure under control, buy Controlling Your Blood Pressure, a Special Health Report from [facility].   Blood Pressure Log   Date   Time  Blood Pressure  Position  Example: Nov 1 9 AM 124/78 sitting

## 2020-08-18 ENCOUNTER — Encounter: Payer: Self-pay | Admitting: Family

## 2020-08-24 ENCOUNTER — Encounter: Payer: Self-pay | Admitting: Family

## 2020-09-12 MED ORDER — CARVEDILOL 25 MG PO TABS: 25.0000 mg | ORAL_TABLET | Freq: Two times a day (BID) | ORAL | 1 refills | Status: DC

## 2020-09-19 NOTE — Progress Notes (Signed)
Cardiology Office Note  Date:  09/20/2020   ID:  CONSTANCE HACKENBERG, DOB Jan 17, 1951, MRN [id number]  PCP:  Kirk Ruths, MD   Chief Complaint  Patient presents with  . Follow-up    2 month. Meds reviewed by the pt. verbally. Pt. c/o having an allergy to Carvedilol with symptoms of itching all over, scatchy throat and swollen lips and eyelids. Pt. has only had 5 days of the Carvedilol.     HPI:  Mr. Mcneal is a 69 year old gentleman  with history of  coronary artery disease,  catheterization in March 2012 showing 50% mid LAD disease, 90% disease of a small diagonal, 50% proximal RCA disease, 70% mid RCA disease, 60-70% distal RCA disease, CABG x2 vessel, LIMA to the LAD, vein graft to the PDA 02/02/2011  PCI of native D1   2012 several months later depression, significant statin intolerance who presents for routine followup of his coronary artery disease and hypertension  S/p COVID 19 - Diagnosed 07/19/20. Lost brother to covid  Younger brother   not been taking Amlodipine caused "chest pain".   intolerance to HCTZ but cannot recall side effect.   Coreg: now with allergy, itching/mouth puffy, stopped the pill  zetia : itching , not taking regular He is taking lisinopril  Brisk walk today, no SOB, no chest pain   drives a truck, full time with state Does not want statin, caused muscle pain, shoulders  EKG personally reviewed by myself on todays visit NSR rate 67 nonspecific  T wave abn (lateral leads), new, discussed today  In the ER 11/2019, headache and concern for high BP Cardiac work-up negative  Echo 03/2019 left ventricle has normal systolic function, with an ejection  fraction of 55-60%. Mild-moderate stenosis of the aortic valve.   Previous vision issue"squiggly in both eyes", "like a kalidescope" Lasted 1 hour No further episodes, back to normal now, no other neurologic issues  Reports having a TIA in October 2014, blurry vision, were findings  difficulty. CT scan of the head was normal. Echocardiogram and Holter performed. Holter showed normal sinus rhythm   PMH:   has a past medical history of CAD (coronary artery disease) (2012), Depression, Gout, Hyperlipidemia, Hypertension, and Lyme disease.  PSH:    Past Surgical History:  Procedure Laterality Date  . CARDIAC CATHETERIZATION  12/25/2010  . CORONARY ANGIOPLASTY     stent x 1   . CORONARY ARTERY BYPASS GRAFT  11/2010   [facility]  . right index finger surgery      Current Outpatient Medications  Medication Sig Dispense Refill  . allopurinol (ZYLOPRIM) 150 mg TABS tablet Take 150 mg by mouth daily.    Marland Kitchen aspirin EC 81 MG tablet Take 81 mg by mouth daily.     Marland Kitchen lisinopril (ZESTRIL) 40 MG tablet TAKE 1 TABLET(40 MG) BY MOUTH DAILY 90 tablet 3  . nitroGLYCERIN (NITROSTAT) 0.4 MG SL tablet Place 1 tablet (0.4 mg total) under the tongue every 5 (five) minutes as needed for chest pain. 25 tablet 0  . Omega-3 Fatty Acids (FISH OIL) 1000 MG CAPS Take by mouth 2 (two) times daily.    Marland Kitchen Bioflavonoid Products (VITAMIN C PLUS PO) Take by mouth daily. (Patient not taking: Reported on 09/20/2020)    . carvedilol (COREG) 25 MG tablet Take 1 tablet (25 mg total) by mouth 2 (two) times daily. (Patient not taking: Reported on 09/20/2020) 180 tablet 1  . ezetimibe (ZETIA) 10 MG tablet Take 1 tablet (10 mg total)  by mouth daily. (Patient not taking: Reported on 09/20/2020) 90 tablet 3  . Magnesium Cl-Calcium Carbonate (SLOW-MAG PO) Take 2 tablets by mouth daily. (Patient not taking: Reported on 09/20/2020)    . VITAMIN D PO Take 2,000 Int'l Units by mouth daily. (Patient not taking: Reported on 09/20/2020)     No current facility-administered medications for this visit.    Allergies:   Definity [perflutren lipid microsphere], Bupropion, Omeprazole, Prednisone, Statins, Trazodone, and Trazodone and nefazodone   Social History:  The patient  reports that he quit smoking about 42 years ago. His  smoking use included cigarettes. He has never used smokeless tobacco. He reports current alcohol use of about 2.0 standard drinks of alcohol per week. He reports that he does not use drugs.   Family History:   family history includes Heart attack (age of onset: 1) in his father.    Review of Systems: Review of Systems  Constitutional: Negative.   HENT: Negative.   Respiratory: Negative.   Cardiovascular: Negative.   Gastrointestinal: Negative.   Musculoskeletal: Negative.   Neurological: Negative.   Psychiatric/Behavioral: Negative.   All other systems reviewed and are negative.   PHYSICAL EXAM: VS:  BP (!) 142/78 (BP Location: Left Arm, Patient Position: Sitting, Cuff Size: Normal)   Pulse 67   Ht 5' 8.5" (1.74 m)   Wt 224 lb 2 oz (101.7 kg)   BMI 33.58 kg/m  , BMI Body mass index is 33.58 kg/m. Constitutional:  oriented to person, place, and time. No distress.  HENT:  Head: Grossly normal Eyes:  no discharge. No scleral icterus.  Neck: No JVD, no carotid bruits  Cardiovascular: Regular rate and rhythm, no murmurs appreciated Pulmonary/Chest: Clear to auscultation bilaterally, no wheezes or rails Abdominal: Soft.  no distension.  no tenderness.  Musculoskeletal: Normal range of motion Neurological:  normal muscle tone. Coordination normal. No atrophy Skin: Skin warm and dry Psychiatric: normal affect, pleasant  Recent Labs: 11/26/2019: BUN 21; Creatinine, Ser 1.26; Hemoglobin 15.8; Platelets 220; Potassium 4.1; Sodium 138    Lipid Panel Lab Results  Component Value Date   CHOL 267 (H) 12/09/2018   HDL 39 (L) 12/09/2018   LDLCALC 176 (H) 12/09/2018   TRIG 258 (H) 12/09/2018      Wt Readings from Last 3 Encounters:  09/20/20 224 lb 2 oz (101.7 kg)  08/17/20 230 lb (104.3 kg)  11/30/19 224 lb (101.6 kg)     ASSESSMENT AND PLAN:  Coronary artery disease involving coronary bypass graft with angina pectoris, unspecified whether native or transplanted heart  (HCC) Currently with no symptoms of angina. No further workup at this time. Continue current medication regimen.  Mixed hyperlipidemia He does not want a statin , shoulder pain Talked about repatha and not interested  On/off zetia 10 mg daily, itching?  Essential hypertension Periodic binges in salt lisinopril 40 mg daily, Not tolerating coreg, he will try lower first May need to go back to metoprolol  S/P CABG x 2 Currently with no symptoms of angina. No further workup at this time. Continue current medication regimen.   shortness of breath Recommend walking program for conditioning    Total encounter time more than 25 minutes  Greater than 50% was spent in counseling and coordination of care with the patient   Orders Placed This Encounter  Procedures  . EKG 12-Lead     Signed, Esmond Plants, M.D., Ph.D. 09/20/2020  [place], [place]

## 2020-09-20 ENCOUNTER — Encounter: Payer: Self-pay | Admitting: Cardiovascular Disease

## 2020-09-20 ENCOUNTER — Other Ambulatory Visit: Payer: Self-pay

## 2020-09-20 ENCOUNTER — Ambulatory Visit: Payer: BC Managed Care – PPO | Admitting: Cardiovascular Disease

## 2020-09-20 VITALS — BP 142/78 | HR 67 | Ht 68.5 in | Wt 224.1 lb

## 2020-09-20 DIAGNOSIS — I10 Essential (primary) hypertension: Secondary | ICD-10-CM | POA: Diagnosis not present

## 2020-09-20 DIAGNOSIS — E785 Hyperlipidemia, unspecified: Secondary | ICD-10-CM

## 2020-09-20 DIAGNOSIS — I25119 Atherosclerotic heart disease of native coronary artery with unspecified angina pectoris: Secondary | ICD-10-CM

## 2020-09-20 DIAGNOSIS — I350 Nonrheumatic aortic (valve) stenosis: Secondary | ICD-10-CM | POA: Diagnosis not present

## 2020-09-20 NOTE — Patient Instructions (Addendum)
Medication Instructions:  Please try the 1/4 to 1/2 dose coreg twice a day   If you need a refill on your cardiac medications before your next appointment, please call your pharmacy.    Lab work: No new labs needed   If you have labs (blood work) drawn today and your tests are completely normal, you will receive your results only by: Marland Kitchen MyChart Message (if you have MyChart) OR . A paper copy in the mail If you have any lab test that is abnormal or we need to change your treatment, we will call you to review the results.   Testing/Procedures: No new testing needed   Follow-Up: At [facility], you and your health needs are our priority.  As part of our continuing mission to provide you with exceptional heart care, we have created designated Provider Care Teams.  These Care Teams include your primary Cardiologist (physician) and Advanced Practice Providers (APPs -  Physician Assistants and Nurse Practitioners) who all work together to provide you with the care you need, when you need it.  . You will need a follow up appointment in 6 months, app ok  . Providers on your designated Care Team:   . Murray Hodgkins, NP . Christell Faith, PA-C . Marrianne Mood, PA-C  Any Other Special Instructions Will Be Listed Below (If Applicable).  COVID-19 Vaccine Information can be found at: ShippingScam.co.uk For questions related to vaccine distribution or appointments, please email [email or web address] [email or web address] or call [phone].

## 2020-09-26 ENCOUNTER — Telehealth: Payer: Self-pay | Admitting: Cardiovascular Disease

## 2020-09-26 MED ORDER — HYDRALAZINE HCL 50 MG PO TABS: 50.0000 mg | ORAL_TABLET | Freq: Two times a day (BID) | ORAL | 3 refills | Status: DC

## 2020-09-26 MED ORDER — METOPROLOL TARTRATE 50 MG PO TABS: 50.0000 mg | ORAL_TABLET | Freq: Two times a day (BID) | ORAL | 3 refills | Status: DC

## 2020-09-26 NOTE — Telephone Encounter (Signed)
Stop Carvedilol - please add to allergy list. Resume Metoprolol Tartrate 50mg  BID.   To get BP to goal of <130/80 recommend starting Hydralazine 25mg  BID.   Loel Dubonnet, NP

## 2020-09-26 NOTE — Telephone Encounter (Signed)
Spoke with patient and after starting the carvedilol 25 mg he started to have Itching. The itching got worse then his mouth was swelling. By night he started having scratchy throat and on the 30th he stopped taking carvediolol. He then spoke with Dr. Rockey Situ who advised to hold and when itching subsided to then try taking 1/2 tablet to a quarter tablet. Patient took quarter tablet then started having itching around eyelids and fingers so last night he did not take a dose and resumed Metoprolol. He states that his systolic has been around 244 cosistently and before medications in the morning it can be in the 150's with after medications in the 130's. Advised that we recommend checking blood pressures 2 hours after medications and to keep a log if possible so we can see how they trend. Let him know that I would send this note to provider for further review and recommendations and that I would call him back. He verbalized understanding of our conversation, agreement with plan, and had no further questions at this time.

## 2020-09-26 NOTE — Telephone Encounter (Signed)
Spoke with patient and reviewed provider recommendations to resume his metoprolol tartrate 50 mg twice a day and to discontinue the carvedilol. Discussed that we would also send in Hydralazine 25 mg twice a day to assist with getting blood pressures down 130/80. Again we discussed monitoring of blood pressures and to please call if any further concerns. He verbalized understanding of our conversation, agreement with plan, and had no further questions at this time.

## 2020-09-26 NOTE — Telephone Encounter (Signed)
Pt c/o medication issue:  1. Name of Medication: Carvedilol   2. How are you currently taking this medication (dosage and times per day)? Stopped Sunday   3. Are you having a reaction (difficulty breathing--STAT)? Itching scratchy throat   4. What is your medication issue? Allergic reaction  Attempted to half dose but this did not work.  Patient went back to taking metoprolol as unable to tolerated carvedilol but metoprolol does not control BP

## 2020-10-31 ENCOUNTER — Other Ambulatory Visit: Payer: Self-pay

## 2020-10-31 DIAGNOSIS — Z20822 Contact with and (suspected) exposure to covid-19: Secondary | ICD-10-CM

## 2020-11-01 LAB — NOVEL CORONAVIRUS, NAA: SARS-CoV-2, NAA: NOT DETECTED

## 2020-11-01 LAB — SARS-COV-2, NAA 2 DAY TAT

## 2020-11-14 ENCOUNTER — Other Ambulatory Visit: Payer: Self-pay

## 2020-11-14 DIAGNOSIS — Z20822 Contact with and (suspected) exposure to covid-19: Secondary | ICD-10-CM

## 2020-11-15 LAB — SARS-COV-2, NAA 2 DAY TAT

## 2020-11-15 LAB — NOVEL CORONAVIRUS, NAA: SARS-CoV-2, NAA: NOT DETECTED

## 2020-11-21 ENCOUNTER — Other Ambulatory Visit: Payer: Self-pay

## 2020-11-21 DIAGNOSIS — Z20822 Contact with and (suspected) exposure to covid-19: Secondary | ICD-10-CM

## 2020-11-22 LAB — NOVEL CORONAVIRUS, NAA: SARS-CoV-2, NAA: NOT DETECTED

## 2020-11-22 LAB — SARS-COV-2, NAA 2 DAY TAT

## 2020-11-28 ENCOUNTER — Other Ambulatory Visit: Payer: Self-pay

## 2020-11-28 DIAGNOSIS — Z20822 Contact with and (suspected) exposure to covid-19: Secondary | ICD-10-CM

## 2020-11-29 LAB — NOVEL CORONAVIRUS, NAA: SARS-CoV-2, NAA: NOT DETECTED

## 2020-11-29 LAB — SARS-COV-2, NAA 2 DAY TAT

## 2020-12-11 ENCOUNTER — Other Ambulatory Visit: Payer: Self-pay | Admitting: Cardiovascular Disease

## 2020-12-22 ENCOUNTER — Other Ambulatory Visit: Payer: Self-pay | Admitting: Cardiovascular Disease

## 2021-03-20 NOTE — Progress Notes (Signed)
Cardiology Office Note  Date:  03/21/2021   ID:  Todd Collins, DOB 1951-06-29, MRN [id number]  PCP:  Kirk Ruths, MD   Chief Complaint  Patient presents with  . 6 month follow up     "doing well." Medications reviewed by the patient verbally.     HPI:  Todd Collins is a 70 year old gentleman  with history of  coronary artery disease,  catheterization in March 2012 showing 50% mid LAD disease, 90% disease of a small diagonal, 50% proximal RCA disease, 70% mid RCA disease, 60-70% distal RCA disease, CABG x2 vessel, LIMA to the LAD, vein graft to the PDA 02/02/2011  PCI of native D1   2012 several months later depression,  statin intolerance who presents for routine followup of his coronary artery disease and hypertension  S/p COVID 19 - Diagnosed 07/19/20. Lost brother to covid   Got covid agai, March 2022, Mild symptoms lasted 2 weeks  Works for [employer], heavy equipment Or driving, less manual labor  Lab work from primary care reviewed with him total cholesterol 270 LDL 179 Discussed prior history of medication trials  Tolerating lisinopril and metoprolol  Denies chest pain or shortness of breath on exertion  Medication intolerance  not been taking Amlodipine caused "chest pain".   intolerance to HCTZ but cannot recall side effect.  Coreg: now with allergy, itching/mouth puffy, stopped the pill zetia : itching , not taking regular Does not want statin, caused muscle pain, shoulders  EKG personally reviewed by myself on todays visit NSR rate 67 nonspecific  T wave abn (lateral leads), new, discussed today  Other past medical history reviewed  ER 11/2019, headache and concern for high BP Cardiac work-up negative  Echo 03/2019 left ventricle has normal systolic function, with an ejection  fraction of 55-60%. Mild-moderate stenosis of the aortic valve.   Previous vision issue"squiggly in both eyes", "like a kalidescope" Lasted 1 hour No further episodes, back  to normal now, no other neurologic issues  Reports having a TIA in October 2014, blurry vision, were findings difficulty. CT scan of the head was normal. Echocardiogram and Holter performed. Holter showed normal sinus rhythm   PMH:   has a past medical history of CAD (coronary artery disease) (2012), Depression, Gout, Hyperlipidemia, Hypertension, and Lyme disease.  PSH:    Past Surgical History:  Procedure Laterality Date  . CARDIAC CATHETERIZATION  12/25/2010  . CORONARY ANGIOPLASTY     stent x 1   . CORONARY ARTERY BYPASS GRAFT  11/2010   [facility]  . right index finger surgery      Current Outpatient Medications  Medication Sig Dispense Refill  . allopurinol (ZYLOPRIM) 150 mg TABS tablet Take 150 mg by mouth daily.    Marland Kitchen aspirin EC 81 MG tablet Take 81 mg by mouth daily.     Marland Kitchen Bioflavonoid Products (VITAMIN C PLUS PO) Take by mouth daily.    Marland Kitchen ezetimibe (ZETIA) 10 MG tablet Take 1 tablet (10 mg total) by mouth daily. 90 tablet 3  . lisinopril (ZESTRIL) 40 MG tablet TAKE 1 TABLET(40 MG) BY MOUTH DAILY 90 tablet 0  . Magnesium Cl-Calcium Carbonate (SLOW-MAG PO) Take 2 tablets by mouth daily.    . metoprolol tartrate (LOPRESSOR) 50 MG tablet TAKE 1 TABLET(50 MG) BY MOUTH TWICE DAILY 180 tablet 0  . nitroGLYCERIN (NITROSTAT) 0.4 MG SL tablet Place 1 tablet (0.4 mg total) under the tongue every 5 (five) minutes as needed for chest pain. 25 tablet 0  .  Omega-3 Fatty Acids (FISH OIL) 1000 MG CAPS Take by mouth 2 (two) times daily.    Marland Kitchen VITAMIN D PO Take 2,000 Int'l Units by mouth daily.     No current facility-administered medications for this visit.    Allergies:   Definity [perflutren lipid microsphere], Carvedilol, Bupropion, Omeprazole, Prednisone, Statins, Trazodone, and Trazodone and nefazodone   Social History:  The patient  reports that he quit smoking about 43 years ago. His smoking use included cigarettes. He has never used smokeless tobacco. He reports current alcohol use of  about 2.0 standard drinks of alcohol per week. He reports that he does not use drugs.   Family History:   family history includes Heart attack (age of onset: 32) in his father.    Review of Systems: Review of Systems  Constitutional: Negative.   HENT: Negative.   Respiratory: Negative.   Cardiovascular: Negative.   Gastrointestinal: Negative.   Musculoskeletal: Negative.   Neurological: Negative.   Psychiatric/Behavioral: Negative.   All other systems reviewed and are negative.   PHYSICAL EXAM: VS:  BP 124/62 (BP Location: Left Arm, Patient Position: Sitting, Cuff Size: Normal)   Pulse 63   Ht 5' 8.5" (1.74 m)   Wt 219 lb 2 oz (99.4 kg)   SpO2 98%   BMI 32.83 kg/m  , BMI Body mass index is 32.83 kg/m. Constitutional:  oriented to person, place, and time. No distress.  HENT:  Head: Grossly normal Eyes:  no discharge. No scleral icterus.  Neck: No JVD, no carotid bruits  Cardiovascular: Regular rate and rhythm, no murmurs appreciated Pulmonary/Chest: Clear to auscultation bilaterally, no wheezes or rails Abdominal: Soft.  no distension.  no tenderness.  Musculoskeletal: Normal range of motion Neurological:  normal muscle tone. Coordination normal. No atrophy Skin: Skin warm and dry Psychiatric: normal affect, pleasant   Recent Labs: No results found for requested labs within last 8760 hours.    Lipid Panel Lab Results  Component Value Date   CHOL 267 (H) 12/09/2018   HDL 39 (L) 12/09/2018   LDLCALC 176 (H) 12/09/2018   TRIG 258 (H) 12/09/2018      Wt Readings from Last 3 Encounters:  03/21/21 219 lb 2 oz (99.4 kg)  09/20/20 224 lb 2 oz (101.7 kg)  08/17/20 230 lb (104.3 kg)     ASSESSMENT AND PLAN:  Coronary artery disease involving coronary bypass graft with angina pectoris, unspecified whether native or transplanted heart (HCC) Currently with no symptoms of angina. No further workup at this time.  Cholesterol management as below  Mixed  hyperlipidemia Reviewed all his prior medication intolerances statin , shoulder pain, willing to retry Talked about repatha , not interested  Side effects to Zetia unclear, willing to retry Prescription for Crestor 10 and Zetia 10 sent in He does realize the importance of aggressive cholesterol management, this was discussed with him  Essential hypertension lisinopril 40 mg daily, metoprolol BP well controlled  S/P CABG x 2 Currently with no symptoms of angina. No further workup at this time.  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 EKG 12-Lead  Signed, Esmond Plants, M.D., Ph.D. 03/21/2021  [place], [place]

## 2021-03-21 ENCOUNTER — Ambulatory Visit (INDEPENDENT_AMBULATORY_CARE_PROVIDER_SITE_OTHER): Payer: BC Managed Care – PPO | Admitting: Cardiovascular Disease

## 2021-03-21 ENCOUNTER — Encounter: Payer: Self-pay | Admitting: Cardiovascular Disease

## 2021-03-21 ENCOUNTER — Other Ambulatory Visit: Payer: Self-pay

## 2021-03-21 VITALS — BP 124/62 | HR 63 | Ht 68.5 in | Wt 219.1 lb

## 2021-03-21 DIAGNOSIS — E785 Hyperlipidemia, unspecified: Secondary | ICD-10-CM

## 2021-03-21 DIAGNOSIS — I25119 Atherosclerotic heart disease of native coronary artery with unspecified angina pectoris: Secondary | ICD-10-CM

## 2021-03-21 DIAGNOSIS — I350 Nonrheumatic aortic (valve) stenosis: Secondary | ICD-10-CM | POA: Diagnosis not present

## 2021-03-21 DIAGNOSIS — M791 Myalgia, unspecified site: Secondary | ICD-10-CM

## 2021-03-21 DIAGNOSIS — E782 Mixed hyperlipidemia: Secondary | ICD-10-CM

## 2021-03-21 DIAGNOSIS — I10 Essential (primary) hypertension: Secondary | ICD-10-CM | POA: Diagnosis not present

## 2021-03-21 DIAGNOSIS — T466X5A Adverse effect of antihyperlipidemic and antiarteriosclerotic drugs, initial encounter: Secondary | ICD-10-CM

## 2021-03-21 MED ORDER — EZETIMIBE 10 MG PO TABS: 10.0000 mg | ORAL_TABLET | Freq: Every day | ORAL | 3 refills | Status: DC

## 2021-03-21 MED ORDER — METOPROLOL TARTRATE 50 MG PO TABS: ORAL_TABLET | ORAL | 3 refills | Status: DC

## 2021-03-21 MED ORDER — LISINOPRIL 40 MG PO TABS: ORAL_TABLET | ORAL | 3 refills | Status: DC

## 2021-03-21 MED ORDER — ROSUVASTATIN CALCIUM 10 MG PO TABS: 10.0000 mg | ORAL_TABLET | Freq: Every day | ORAL | 3 refills | Status: DC

## 2021-03-21 NOTE — Patient Instructions (Signed)
Medication Instructions:  Please retry zetia 10 mg daily with crestor 10 mg daily  If you need a refill on your cardiac medications before your next appointment, please call your pharmacy.    Lab work: No new labs needed   If you have labs (blood work) drawn today and your tests are completely normal, you will receive your results only by: Marland Kitchen MyChart Message (if you have MyChart) OR . A paper copy in the mail If you have any lab test that is abnormal or we need to change your treatment, we will call you to review the results.   Testing/Procedures: No new testing needed   Follow-Up: At [facility], you and your health needs are our priority.  As part of our continuing mission to provide you with exceptional heart care, we have created designated Provider Care Teams.  These Care Teams include your primary Cardiologist (physician) and Advanced Practice Providers (APPs -  Physician Assistants and Nurse Practitioners) who all work together to provide you with the care you need, when you need it.  . You will need a follow up appointment in 12 months  . Providers on your designated Care Team:   . Murray Hodgkins, NP . Christell Faith, PA-C . Marrianne Mood, PA-C  Any Other Special Instructions Will Be Listed Below (If Applicable).  COVID-19 Vaccine Information can be found at: [email or web address].uk For questions related to vaccine distribution or appointments, please email [email or web address] or call [phone].

## 2022-02-26 ENCOUNTER — Other Ambulatory Visit: Payer: Self-pay | Admitting: Cardiovascular Disease

## 2022-02-26 ENCOUNTER — Other Ambulatory Visit: Payer: Self-pay | Admitting: Family

## 2022-02-27 ENCOUNTER — Other Ambulatory Visit: Payer: Self-pay | Admitting: Cardiovascular Disease

## 2022-02-27 ENCOUNTER — Other Ambulatory Visit: Payer: Self-pay | Admitting: Family

## 2022-02-27 NOTE — Telephone Encounter (Signed)
Rx(s) sent to pharmacy electronically.  

## 2022-03-15 ENCOUNTER — Ambulatory Visit (INDEPENDENT_AMBULATORY_CARE_PROVIDER_SITE_OTHER): Payer: BC Managed Care – PPO | Admitting: Urology

## 2022-03-15 ENCOUNTER — Encounter: Payer: Self-pay | Admitting: Urology

## 2022-03-15 VITALS — BP 161/82 | HR 51 | Ht 68.5 in | Wt 219.0 lb

## 2022-03-15 DIAGNOSIS — R972 Elevated prostate specific antigen [PSA]: Secondary | ICD-10-CM | POA: Diagnosis not present

## 2022-03-15 DIAGNOSIS — N529 Male erectile dysfunction, unspecified: Secondary | ICD-10-CM

## 2022-03-15 DIAGNOSIS — R3912 Poor urinary stream: Secondary | ICD-10-CM | POA: Diagnosis not present

## 2022-03-15 DIAGNOSIS — N401 Enlarged prostate with lower urinary tract symptoms: Secondary | ICD-10-CM | POA: Diagnosis not present

## 2022-03-15 LAB — URINALYSIS, COMPLETE
Bilirubin, UA: NEGATIVE
Glucose, UA: NEGATIVE
Ketones, UA: NEGATIVE
Leukocytes,UA: NEGATIVE
Nitrite, UA: NEGATIVE
Protein,UA: NEGATIVE
RBC, UA: NEGATIVE
Specific Gravity, UA: <1.005 — ABNORMAL LOW (ref 1.005–1.030)
Urobilinogen, Ur: 0.2 mg/dL (ref 0.2–1.0)
pH, UA: 5.5 (ref 5.0–7.5)

## 2022-03-15 LAB — BLADDER SCAN AMB NON-IMAGING: Scan Result: 219

## 2022-03-15 LAB — MICROSCOPIC EXAMINATION
Bacteria, UA: NONE SEEN
Epithelial Cells (non renal): NONE SEEN /hpf (ref 0–10)
RBC, Urine: NONE SEEN /hpf (ref 0–2)

## 2022-03-15 MED ORDER — SILDENAFIL CITRATE 20 MG PO TABS: ORAL_TABLET | ORAL | 11 refills | Status: DC

## 2022-03-15 NOTE — Progress Notes (Signed)
03/15/2022 10:49 AM   Todd Collins 1951-09-08 [id number]  Referring provider:  Kirk Ruths, MD [place] [facility] [place],  [place] [address] Chief Complaint  Patient presents with   Elevated PSA    HPI: Todd Collins is a 71 y.o.male for further evaluation of elevated PSA.   He underwent routine labs with his PCP, Dr Frazier Richards, on 02/28/2022. His PCP at the time was 4.36 with a previous PSA of 3.77 on 02/01/2021.   He reports today that as a younger man, he was able to hold his urine during the daytime and 1-2x daily. He has difficulty starting his stream and he intermittently feels like he is not emptying fully.  He has not tried any medications for these symptoms.   He reports ED. he does at times have difficulty maintaining and achieving erections.  He is never used PDE 5 inhibitors but is interested in this.  He does hold a prescription for nitroglycerin but has never had to use this medication.    IPSS     Row Name 03/15/22 0900         International Prostate Symptom Score   How often have you had the sensation of not emptying your bladder? Less than half the time     How often have you had to urinate less than every two hours? Not at All     How often have you found you stopped and started again several times when you urinated? More than half the time     How often have you found it difficult to postpone urination? Less than 1 in 5 times     How often have you had a weak urinary stream? Almost always     How often have you had to strain to start urination? Not at All     How many times did you typically get up at night to urinate? 2 Times     Total IPSS Score 14       Quality of Life due to urinary symptoms   If you were to spend the rest of your life with your urinary condition just the way it is now how would you feel about that? Mixed              Score:  1-7 Mild 8-19 Moderate 20-35 Severe   PSA  trend 02/28/2022  4.36 02/01/2021  3.77 07/20/2019   3.15      PMH: Past Medical History:  Diagnosis Date   CAD (coronary artery disease) 2012   a. s/p 2 vessel CABG (LIMA-LAD, SVG-PDA); b. cath 03/2011: LM 20%, mLAD 75%, pD1 100% s/p PCI/DES, mildly diseaed LCx, mRCA 80%, dRCA 95%, patent grafts   Depression    Gout    Hyperlipidemia    Hypertension    Lyme disease     Surgical History: Past Surgical History:  Procedure Laterality Date   CARDIAC CATHETERIZATION  12/25/2010   CORONARY ANGIOPLASTY     stent x 1    CORONARY ARTERY BYPASS GRAFT  11/2010   [facility]   right index finger surgery      Home Medications:  Allergies as of 03/15/2022       Reactions   Definity [perflutren Lipid Microsphere] Other (See Comments)   Patient experiences severe low back pain for 30-45 seconds after injection   Carvedilol Itching, Swelling   Bupropion Other (See Comments)   Other reaction(s): RASH Upper extremity edema Other  reaction(s): RASH Upper extremity edema Other reaction(s): RASH   Omeprazole Other (See Comments)   Other reaction(s): Other (See Comments) Caused muscle cramps Other reaction(s): Other (See Comments) Caused muscle cramps   Prednisone Other (See Comments)   Elevated blood sugar Other reaction(s): SHORTNESS OF BREATH Elevated blood sugars Elevated blood sugar Other reaction(s): SHORTNESS OF BREATH   Statins    Other reaction(s): Muscle Pain   Trazodone Other (See Comments)   Shortness of breath  wheezing   Trazodone And Nefazodone    Shortness of breath  wheezing        Medication List        Accurate as of Mar 15, 2022 11:59 PM. If you have any questions, ask your nurse or doctor.          STOP taking these medications    ezetimibe 10 MG tablet Commonly known as: ZETIA Stopped by: Hollice Espy, MD   SLOW-MAG PO Stopped by: Hollice Espy, MD   VITAMIN C PLUS PO Stopped by: Hollice Espy, MD   VITAMIN D PO Stopped by: Hollice Espy, MD       TAKE these medications    allopurinol 150 mg Tabs tablet Commonly known as: ZYLOPRIM Take 150 mg by mouth daily.   aspirin EC 81 MG tablet Take 81 mg by mouth daily.   Fish Oil 1000 MG Caps Take by mouth 2 (two) times daily.   lisinopril 40 MG tablet Commonly known as: ZESTRIL TAKE 1 TABLET(40 MG) BY MOUTH DAILY   metoprolol tartrate 50 MG tablet Commonly known as: LOPRESSOR Take 1 tablet (50 mg total) by mouth 2 (two) times daily. NEED APPOINTMENT   nitroGLYCERIN 0.4 MG SL tablet Commonly known as: NITROSTAT Place 1 tablet (0.4 mg total) under the tongue every 5 (five) minutes as needed for chest pain.   rosuvastatin 10 MG tablet Commonly known as: CRESTOR Take 1 tablet (10 mg total) by mouth daily.   sildenafil 20 MG tablet Commonly known as: REVATIO Take 3-5 tablets 1 hour prior to intercourse as needed Started by: Hollice Espy, MD        Allergies:  Allergies  Allergen Reactions   Definity [Perflutren Lipid Microsphere] Other (See Comments)    Patient experiences severe low back pain for 30-45 seconds after injection   Carvedilol Itching and Swelling   Bupropion Other (See Comments)    Other reaction(s): RASH Upper extremity edema Other reaction(s): RASH Upper extremity edema Other reaction(s): RASH   Omeprazole Other (See Comments)    Other reaction(s): Other (See Comments) Caused muscle cramps Other reaction(s): Other (See Comments) Caused muscle cramps   Prednisone Other (See Comments)    Elevated blood sugar  Other reaction(s): SHORTNESS OF BREATH Elevated blood sugars Elevated blood sugar Other reaction(s): SHORTNESS OF BREATH   Statins     Other reaction(s): Muscle Pain   Trazodone Other (See Comments)    Shortness of breath  wheezing   Trazodone And Nefazodone     Shortness of breath  wheezing    Family History: Family History  Problem Relation Age of Onset   Heart attack Father 44    Social History:   reports that he quit smoking about 44 years ago. His smoking use included cigarettes. He has never used smokeless tobacco. He reports current alcohol use of about 2.0 standard drinks per week. He reports that he does not use drugs.   Physical Exam: BP (!) 161/82   Pulse (!) 51   Ht 5' 8.5" (  1.74 m)   Wt 219 lb (99.3 kg)   BMI 32.81 kg/m   Constitutional:  Alert and oriented, No acute distress. HEENT: [place] AT, moist mucus membranes.  Trachea midline, no masses. Cardiovascular: No clubbing, cyanosis, or edema. Respiratory: Normal respiratory effort, no increased work of breathing. Rectal: Normal sphincter tone,  40  CC prostate, smooth no nodules Skin: No rashes, bruises or suspicious lesions. Neurologic: Grossly intact, no focal deficits, moving all 4 extremities. Psychiatric: Normal mood and affect.  Laboratory Data: Lab Results  Component Value Date   CREATININE 1.26 (H) 11/26/2019   No results found for: HGBA1C  Pertinent Imaging: Results for orders placed or performed in visit on 03/15/22  Microscopic Examination   Urine  Result Value Ref Range   WBC, UA 0-5 0 - 5 /hpf   RBC None seen 0 - 2 /hpf   Epithelial Cells (non renal) None seen 0 - 10 /hpf   Bacteria, UA None seen None seen/Few  PSA  Result Value Ref Range   Prostate Specific Ag, Serum 4.7 (H) 0.0 - 4.0 ng/mL  Urinalysis, Complete  Result Value Ref Range   Specific Gravity, UA <1.005 (L) 1.005 - 1.030   pH, UA 5.5 5.0 - 7.5   Color, UA Yellow Yellow   Appearance Ur Clear Clear   Leukocytes,UA Negative Negative   Protein,UA Negative Negative/Trace   Glucose, UA Negative Negative   Ketones, UA Negative Negative   RBC, UA Negative Negative   Bilirubin, UA Negative Negative   Urobilinogen, Ur 0.2 0.2 - 1.0 mg/dL   Nitrite, UA Negative Negative   Microscopic Examination See below:   Bladder Scan (Post Void Residual) in office  Result Value Ref Range   Scan Result 219     Assessment & Plan:   Elevated/  rising PSA  -  We reviewed the implications of an elevated PSA and the uncertainty surrounding it. In general, a man's PSA increases with age and is produced by both normal and cancerous prostate tissue. Differential for elevated PSA is BPH, prostate cancer, infection, recent intercourse/ejaculation, prostate infarction, recent urethroscopic manipulation (foley placement/cystoscopy) and prostatitis. Management of an elevated PSA can include observation or prostate biopsy and wediscussed this in detail. We discussed that indications for prostate biopsy are defined by age and race specific PSA cutoffs as well as a PSA velocity of 0.75/year. - Risk of prostate biopsy include risk of blood in urine,stool, and ejaculate, serious infection, and discomfort.  - PSA; pending. If it is still elevating recommend prostate biopsy to rule out any malignancy; he is agreeable with the plan. Will call him with results.  2. BPH with weak urinary stream  - We discussed addition of Flomax to his regimen for urinary symptoms. We discussed side effects including retrograde ejaculation. He declined this offer. - We also discussed outlet procedure. Discussed that he would have to undergo a cystoscopy/ TRUS to evaluate prostate anatomy and decide which procedure he would be a good candidate of. He is interested in a procedure.   3. ED  - We discussed the pathophysiology of erectile dysfunction today along with possible contributing factors. Discussed possible treatment options including PDE 5 inhibitors, vacuum erectile device, intracavernosal injection, MUSE, and placement of the inflatable or malleable penile prosthesis for refractory cases.  In terms of PDE 5 inhibitors, we discussed contraindications for this medication as well as common side effects. Patient was counseled on optimal use. All of his questions were answered in detail. - He is  interested in medication today.  - Sildenafil 20 mg; prescribed   Schedule  cysto/ trus  Conley Rolls as a scribe for Hollice Espy, MD.,have documented all relevant documentation on the behalf of Hollice Espy, MD,as directed by  Hollice Espy, MD while in the presence of Hollice Espy, [place] [address] [place],  [id number] [phone]

## 2022-03-15 NOTE — Patient Instructions (Signed)
Prostate Biopsy Instructions  Stop all aspirin or blood thinners (aspirin, plavix, coumadin, warfarin, motrin, ibuprofen, advil, aleve, naproxen, naprosyn) for 7 days prior to the procedure.  If you have any questions about stopping these medications, please contact your primary care physician or cardiologist.  Having a light meal prior to the procedure is recommended.  If you are diabetic or have low blood sugar please bring a small snack or glucose tablet.  A Fleets enema is needed to be purchased over the counter at a local pharmacy and used 2 hours before you scheduled appointment.  This can be purchased over the counter at any pharmacy.  Antibiotics will be administered in the clinic at the time of the procedure unless otherwise specified.    Please bring someone with you to the procedure to drive you home.  A follow up appointment has been scheduled for you to receive the results of the biopsy.  If you have any questions or concerns, please feel free to call the office at [phone] or send a Mychart message.    Thank you, Staff at [facility]'   Cystoscopy Cystoscopy is a procedure that is used to help diagnose and sometimes treat conditions that affect the lower urinary tract. The lower urinary tract includes the bladder and the urethra. The urethra is the tube that drains urine from the bladder. Cystoscopy is done using a thin, tube-shaped instrument with a light and camera at the end (cystoscope). The cystoscope may be hard or flexible, depending on the goal of the procedure. The cystoscope is inserted through the urethra, into the bladder. Cystoscopy may be recommended if you have: Urinary tract infections that keep coming back. Blood in the urine (hematuria). An inability to control when you urinate (urinary incontinence) or an overactive bladder. Unusual cells found in a urine sample. A blockage in the urethra, such as a urinary stone. Painful urination. An  abnormality in the bladder found during an intravenous pyelogram (IVP) or CT scan. What are the risks? Generally, this is a safe procedure. However, problems may occur, including: Infection. Bleeding.  What happens during the procedure?  You will be given one or more of the following: A medicine to numb the area (local anesthetic). The area around the opening of your urethra will be cleaned. The cystoscope will be passed through your urethra into your bladder. Germ-free (sterile) fluid will flow through the cystoscope to fill your bladder. The fluid will stretch your bladder so that your health care provider can clearly examine your bladder walls. Your doctor will look at the urethra and bladder. The cystoscope will be removed The procedure may vary among health care providers  What can I expect after the procedure? After the procedure, it is common to have: Some soreness or pain in your urethra. Urinary symptoms. These include: Mild pain or burning when you urinate. Pain should stop within a few minutes after you urinate. This may last for up to a few days after the procedure. A small amount of blood in your urine for several days. Feeling like you need to urinate but producing only a small amount of urine. Follow these instructions at home: General instructions Return to your normal activities as told by your health care provider.  Drink plenty of fluids after the procedure. Keep all follow-up visits as told by your health care provider. This is important. Contact a health care provider if you: Have pain that gets worse or does not get better with medicine, especially pain  when you urinate lasting longer than 72 hours after the procedure. Have trouble urinating. Get help right away if you: Have blood clots in your urine. Have a fever or chills. Are unable to urinate. Summary Cystoscopy is a procedure that is used to help diagnose and sometimes treat conditions that affect the  lower urinary tract. Cystoscopy is done using a thin, tube-shaped instrument with a light and camera at the end. After the procedure, it is common to have some soreness or pain in your urethra. It is normal to have blood in your urine after the procedure.  If you were prescribed an antibiotic medicine, take it as told by your health care provider.  This information is not intended to replace advice given to you by your health care provider. Make sure you discuss any questions you have with your health care provider. Document Revised: 09/30/2018 Document Reviewed: 09/30/2018 Elsevier Patient Education  [employer].

## 2022-03-16 LAB — PSA: Prostate Specific Ag, Serum: 4.7 ng/mL — ABNORMAL HIGH (ref 0.0–4.0)

## 2022-03-20 ENCOUNTER — Telehealth: Payer: Self-pay | Admitting: Urology

## 2022-03-20 NOTE — Telephone Encounter (Signed)
Left VM to return my call to review instructions

## 2022-03-20 NOTE — Telephone Encounter (Signed)
Repeat PSA was actually even a little bit higher than the initial.  At the time of cystoscopy, I would recommend biopsy rather than TRUS as discussed.  Please let him know and review instructions.  He may continue his low dose aspirin.  Hollice Espy, MD

## 2022-03-23 ENCOUNTER — Telehealth: Payer: Self-pay | Admitting: Cardiovascular Disease

## 2022-03-23 NOTE — Telephone Encounter (Signed)
Called and spoke with patient.   Patient reports that his blood pressure has been elevated for about a week. States that he has been taking his medications are prescribed.   BPs provided are from today. The 187/89 is from prior to medications, and the other two are from after medications.   Patient denies any associated symptoms. Denies increased salt intake or fluid intake.   Advised patient that I would forward his concerns to MD. Pt verbalized understanding and voiced appreciation for the call.

## 2022-03-23 NOTE — Telephone Encounter (Signed)
Pt c/o BP issue: STAT if pt c/o blurred vision, one-sided weakness or slurred speech  1. What are your last 5 BP readings? 187/89; 167/83; 160/89  2. Are you having any other symptoms (ex. Dizziness, headache, blurred vision, passed out)? No; sometimes feels like heart is skips a beat  3. What is your BP issue? Patient is worried that BP is elevated. Wants to know if another BP medication could be sent iin

## 2022-03-27 ENCOUNTER — Encounter: Payer: Self-pay | Admitting: Family Medicine

## 2022-03-27 NOTE — Telephone Encounter (Signed)
Appt made, instructions were sent via mychart by Morey Hummingbird and discussed with the patient over the phone.  Sharyn Lull

## 2022-03-27 NOTE — Telephone Encounter (Signed)
2nd attempt to reach patient-left VM to return my call

## 2022-04-03 ENCOUNTER — Ambulatory Visit: Payer: BC Managed Care – PPO | Admitting: Urology

## 2022-04-03 ENCOUNTER — Encounter: Payer: Self-pay | Admitting: Urology

## 2022-04-03 VITALS — BP 161/81 | HR 56

## 2022-04-03 DIAGNOSIS — R972 Elevated prostate specific antigen [PSA]: Secondary | ICD-10-CM | POA: Diagnosis not present

## 2022-04-03 DIAGNOSIS — C61 Malignant neoplasm of prostate: Secondary | ICD-10-CM

## 2022-04-03 MED ORDER — GENTAMICIN SULFATE 40 MG/ML IJ SOLN
80.0000 mg | Freq: Once | INTRAMUSCULAR | Status: AC
  Administered 2022-04-03: 80 mg via INTRAMUSCULAR

## 2022-04-03 MED ORDER — LEVOFLOXACIN 500 MG PO TABS
500.0000 mg | ORAL_TABLET | Freq: Once | ORAL | Status: AC
  Administered 2022-04-03: 500 mg via ORAL

## 2022-04-03 NOTE — Progress Notes (Signed)
   04/03/22  CC:  Chief Complaint  Patient presents with   Biopsy    HPI: Todd Collins is a 71 y.o. 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   He underwent a PSA screening with his PCP on 02/28/2022 that was 4.36 with a previous PSA of 3.77 on 02/01/2021.   His most recent PSA was 4.7 on 03/15/2022.  He underwent a rectal exam on 03/20/2022 that showed normal sphincter tone, 40 cc prostate, with no nodules and smooth.    Vitals:   04/03/22 0942  BP: (!) 161/81  Pulse: (!) 56   NED. A&Ox3.   No respiratory distress   Abd soft, NT, ND Normal external genitalia with patent urethral meatus  Prostate Biopsy Procedure   Informed consent was obtained after discussing risks/benefits of the procedure.  A time out was performed to ensure correct patient identity.  Pre-Procedure: - Last PSA Level:  Component     Latest Ref Rng 03/15/2022  Prostate Specific Ag, Serum     0.0 - 4.0 ng/mL 4.7 (H)     Legend: (H) High  - Gentamicin given prophylactically - Levaquin 500 mg administered PO -Transrectal Ultrasound performed revealing a 63.7 gm prostate -No significant hypoechoic or median lobe noted  Procedure: - Prostate block performed using 10 cc 1% lidocaine and biopsies taken from sextant areas, a total of 12 under ultrasound guidance.  Post-Procedure: - Patient tolerated the procedure well - He was counseled to seek immediate medical attention if experiences any severe pain, significant bleeding, or fevers - Return in one week to discuss biopsy results  I,Kailey Littlejohn,acting as a scribe for Hollice Espy, MD.,have documented all relevant documentation on the behalf of Hollice Espy, MD,as directed by  Hollice Espy, MD while in the presence of Hollice Espy, MD.  I have reviewed the above documentation for accuracy and completeness, and I agree with the above.    Hollice Espy, MD

## 2022-04-03 NOTE — Patient Instructions (Signed)
Transrectal Ultrasound-Guided Prostate Biopsy, Care After The following information offers guidance on how to care for yourself after your procedure. Your health care provider may also give you more specific instructions. If you have problems or questions, contact your health care provider. What can I expect after the procedure? After the procedure, it is common to have: Pain and discomfort near your rectum, especially while sitting. Pink-colored urine due to small amounts of blood in your urine. A burning feeling while urinating. Blood in your stool (feces) or bleeding from your rectum. Blood in your semen. Follow these instructions at home: Medicines Take over-the-counter and prescription medicines only as told by your health care provider. If you were given a sedative during your procedure, it can affect you for several hours. Do not drive or operate machinery until your health care provider says that it is safe. If you were prescribed an antibiotic medicine, take it as told by your health care provider. Do not stop using the antibiotic even if you start to feel better. Activity  Return to your normal activities as told by your health care provider. Ask your health care provider what activities are safe for you. Ask your health care provider when it is okay for you to resume sexual activity. You may have to avoid lifting. Ask your health care provider how much you can safely lift. General instructions  Drink enough fluid to keep your urine pale yellow. Watch your urine, stool, and semen for new or increased bleeding. Keep all follow-up visits. This is important. Contact a health care provider if: You have any of the following: Blood clots in your urine or stool. Blood in your urine more than 2 weeks after the procedure. Blood in your semen more than 2 months after the procedure. New or increased bleeding in your urine, stool, or semen. Severe pain in your abdomen. Your urine smells  bad or unusual. You have trouble urinating. Your lower abdomen feels firm. You have problems getting an erection. You have nausea or you vomit. Get help right away if: You have a fever or chills. This could be a sign of infection. You have bright red urine. You have severe pain that does not get better with medicine. You cannot urinate. Summary After this procedure, it is common to have pain and discomfort around your rectum, especially while sitting. You may have blood in your urine and stool after the procedure. It is common to have blood in your semen after this procedure. Get help right away if you have a fever or chills. This could be a sign of infection. This information is not intended to replace advice given to you by your health care provider. Make sure you discuss any questions you have with your health care provider. Document Revised: 04/03/2021 Document Reviewed: 04/03/2021 Elsevier Patient Education  2023 [employer].  

## 2022-04-04 LAB — SURGICAL PATHOLOGY

## 2022-04-06 ENCOUNTER — Telehealth: Payer: Self-pay | Admitting: Cardiovascular Disease

## 2022-04-06 NOTE — Telephone Encounter (Signed)
Patient dropped off Parking Placard form to be filled will pick up at next appt. Placed in box

## 2022-04-12 ENCOUNTER — Ambulatory Visit: Payer: BC Managed Care – PPO | Admitting: Urology

## 2022-04-13 ENCOUNTER — Encounter: Payer: Self-pay | Admitting: Urology

## 2022-04-13 ENCOUNTER — Ambulatory Visit: Payer: BC Managed Care – PPO | Admitting: Urology

## 2022-04-13 VITALS — BP 165/90 | HR 56 | Ht 68.5 in | Wt 200.0 lb

## 2022-04-13 DIAGNOSIS — R3912 Poor urinary stream: Secondary | ICD-10-CM

## 2022-04-13 DIAGNOSIS — N401 Enlarged prostate with lower urinary tract symptoms: Secondary | ICD-10-CM

## 2022-04-13 DIAGNOSIS — C61 Malignant neoplasm of prostate: Secondary | ICD-10-CM

## 2022-04-13 DIAGNOSIS — R351 Nocturia: Secondary | ICD-10-CM

## 2022-04-13 MED ORDER — TAMSULOSIN HCL 0.4 MG PO CAPS: 0.4000 mg | ORAL_CAPSULE | Freq: Every day | ORAL | 3 refills | Status: DC

## 2022-04-17 ENCOUNTER — Encounter: Payer: Self-pay | Admitting: Medical

## 2022-04-17 ENCOUNTER — Ambulatory Visit: Payer: BC Managed Care – PPO | Admitting: Medical

## 2022-04-17 VITALS — BP 142/80 | HR 55 | Ht 68.5 in | Wt 219.6 lb

## 2022-04-17 DIAGNOSIS — I350 Nonrheumatic aortic (valve) stenosis: Secondary | ICD-10-CM | POA: Diagnosis not present

## 2022-04-17 DIAGNOSIS — E782 Mixed hyperlipidemia: Secondary | ICD-10-CM

## 2022-04-17 DIAGNOSIS — I25119 Atherosclerotic heart disease of native coronary artery with unspecified angina pectoris: Secondary | ICD-10-CM | POA: Diagnosis not present

## 2022-04-17 DIAGNOSIS — I10 Essential (primary) hypertension: Secondary | ICD-10-CM

## 2022-04-17 MED ORDER — NITROGLYCERIN 0.4 MG SL SUBL: 0.4000 mg | SUBLINGUAL_TABLET | SUBLINGUAL | 3 refills | Status: DC | PRN

## 2022-04-17 MED ORDER — METOPROLOL TARTRATE 50 MG PO TABS: 50.0000 mg | ORAL_TABLET | Freq: Two times a day (BID) | ORAL | 3 refills | Status: DC

## 2022-04-17 MED ORDER — LISINOPRIL 40 MG PO TABS: 40.0000 mg | ORAL_TABLET | Freq: Every day | ORAL | 3 refills | Status: DC

## 2022-05-24 ENCOUNTER — Ambulatory Visit (INDEPENDENT_AMBULATORY_CARE_PROVIDER_SITE_OTHER): Payer: BC Managed Care – PPO

## 2022-05-24 DIAGNOSIS — I350 Nonrheumatic aortic (valve) stenosis: Secondary | ICD-10-CM

## 2022-05-24 LAB — ECHOCARDIOGRAM COMPLETE
AR max vel: 1.49 cm2
AV Area VTI: 1.88 cm2
AV Area mean vel: 1.48 cm2
AV Mean grad: 12.3 mmHg
AV Peak grad: 19.8 mmHg
Ao pk vel: 2.23 m/s
Area-P 1/2: 3.08 cm2
S' Lateral: 3.1 cm

## 2022-05-25 ENCOUNTER — Ambulatory Visit: Payer: BC Managed Care – PPO | Admitting: Cardiovascular Disease

## 2022-06-12 ENCOUNTER — Ambulatory Visit: Payer: BC Managed Care – PPO | Admitting: Urology

## 2022-10-09 ENCOUNTER — Telehealth: Payer: Self-pay | Admitting: Urology

## 2022-10-09 ENCOUNTER — Other Ambulatory Visit: Payer: BC Managed Care – PPO

## 2022-10-09 DIAGNOSIS — C61 Malignant neoplasm of prostate: Secondary | ICD-10-CM

## 2022-10-09 NOTE — Telephone Encounter (Signed)
Left message to call back  

## 2022-10-09 NOTE — Telephone Encounter (Signed)
Please offer him an office visit with either myself Sam or Larene Beach to discuss his urinary issues.  Tentatively, he could bring this up during his next follow-up visit but that is not for 6 months.  Hollice Espy, MD

## 2022-10-09 NOTE — Telephone Encounter (Signed)
Patient was in office for lab work today, and wanted to let Dr. Erlene Quan know that he stopped taking Tamsulosin in July. He said that it caused him to "white out". He was also having tingling in right arm, ache in right arm while lying on stomach, pain in back between shoulder blades, general weakness; no tolerance for heat (working in sun). He stated that he got better when he quit taking the medication. He is still having urinary issues, and would like to know if there is something else he can take in place of this. [facility] is [facility] [address] in [place].

## 2022-10-10 ENCOUNTER — Ambulatory Visit: Payer: BC Managed Care – PPO | Admitting: Urology

## 2022-10-10 LAB — PSA: Prostate Specific Ag, Serum: 4.4 ng/mL — ABNORMAL HIGH (ref 0.0–4.0)

## 2022-10-11 NOTE — Telephone Encounter (Signed)
Patient advised and scheduled for 11/06/22

## 2022-10-24 ENCOUNTER — Emergency Department: Payer: BC Managed Care – PPO

## 2022-10-24 ENCOUNTER — Other Ambulatory Visit: Payer: Self-pay

## 2022-10-24 ENCOUNTER — Emergency Department
Admission: EM | Admit: 2022-10-24 | Discharge: 2022-10-24 | Disposition: A | Discharge status: Home / Self Care | Payer: BC Managed Care – PPO | Attending: Emergency Medicine | Admitting: Emergency Medicine

## 2022-10-24 DIAGNOSIS — R079 Chest pain, unspecified: Secondary | ICD-10-CM | POA: Diagnosis not present

## 2022-10-24 DIAGNOSIS — I10 Essential (primary) hypertension: Secondary | ICD-10-CM | POA: Insufficient documentation

## 2022-10-24 DIAGNOSIS — M79601 Pain in right arm: Secondary | ICD-10-CM | POA: Diagnosis present

## 2022-10-24 DIAGNOSIS — I2510 Atherosclerotic heart disease of native coronary artery without angina pectoris: Secondary | ICD-10-CM | POA: Insufficient documentation

## 2022-10-24 DIAGNOSIS — Z951 Presence of aortocoronary bypass graft: Secondary | ICD-10-CM | POA: Diagnosis not present

## 2022-10-24 LAB — BASIC METABOLIC PANEL
Anion gap: 8 (ref 5–15)
BUN: 16 mg/dL (ref 8–23)
CO2: 22 mmol/L (ref 22–32)
Calcium: 9 mg/dL (ref 8.9–10.3)
Chloride: 108 mmol/L (ref 98–111)
Creatinine, Ser: 1.17 mg/dL (ref 0.61–1.24)
GFR, Estimated: >60 mL/min (ref >60)
Glucose, Bld: 96 mg/dL (ref 70–99)
Potassium: 3.7 mmol/L (ref 3.5–5.1)
Sodium: 138 mmol/L (ref 135–145)

## 2022-10-24 LAB — CBC
HCT: 44.8 % (ref 39.0–52.0)
Hemoglobin: 15.5 g/dL (ref 13.0–17.0)
MCH: 29.7 pg (ref 26.0–34.0)
MCHC: 34.6 g/dL (ref 30.0–36.0)
MCV: 85.8 fL (ref 80.0–100.0)
Platelets: 219 10*3/uL (ref 150–400)
RBC: 5.22 MIL/uL (ref 4.22–5.81)
RDW: 12 % (ref 11.5–15.5)
WBC: 6.3 10*3/uL (ref 4.0–10.5)
nRBC: 0 % (ref 0.0–0.2)

## 2022-10-24 LAB — TROPONIN I (HIGH SENSITIVITY): Troponin I (High Sensitivity): 4 ng/L (ref <18)

## 2022-10-24 NOTE — ED Provider Notes (Signed)
[facility] Provider Note    Event Date/Time   First MD Initiated Contact with Patient 10/24/22 1545     (approximate)  History   Chief Complaint: Arm Pain  HPI  Todd Collins is a 72 y.o. male the past medical history of CAD, hypertension, hyperlipidemia, presents emergency department for pain in his back radiating somewhat to his right arm.  According to the patient over the past 8 days or so he has been experiencing some pain in his back, states when he extends his neck backwards sometimes the pain will shoot into the right arm.  Patient has a history of a CABG, was concerned this could be cardiac in nature so he came to the emergency department for evaluation.  Patient follows up with Dr. Rockey Situ of cardiology.  Denies any shortness of breath nausea or diaphoresis.  States the pain seems to occur mostly at night and appears to be positional.  Physical Exam   Triage Vital Signs: ED Triage Vitals  Enc Vitals Group     BP 10/24/22 1316 (!) 168/85     Pulse Rate 10/24/22 1316 (!) 59     Resp 10/24/22 1316 16     Temp 10/24/22 1316 98.5 F (36.9 C)     Temp Source 10/24/22 1316 Oral     SpO2 10/24/22 1316 98 %     Weight 10/24/22 1317 219 lb (99.3 kg)     Height 10/24/22 1317 5' 8.5" (1.74 m)     Head Circumference --      Peak Flow --      Pain Score 10/24/22 1325 4     Pain Loc --      Pain Edu? --      Excl. in [place]? --     Most recent vital signs: Vitals:   10/24/22 1316  BP: (!) 168/85  Pulse: (!) 59  Resp: 16  Temp: 98.5 F (36.9 C)  SpO2: 98%    General: Awake, no distress.  CV:  Good peripheral perfusion.  Regular rate and rhythm  Resp:  Normal effort.  Equal breath sounds bilaterally.  Abd:  No distention.  Soft, nontender.  No rebound or guarding. Other:  Posterior chest is nontender to palpation.  Unable to reproduce the pain with neck movement.  Patient denies any current discomfort.   ED Results / Procedures / Treatments    EKG  EKG viewed and interpreted by myself shows sinus bradycardia 59 bpm with a narrow QRS, normal axis, slight PR prolongation otherwise normal intervals, nonspecific but no concerning ST changes.  RADIOLOGY  Patient discharged prior to x-ray being performed.   MEDICATIONS ORDERED IN ED: Medications - No data to display   IMPRESSION / MDM / [place] / ED COURSE  I reviewed the triage vital signs and the nursing notes.  Patient's presentation is most consistent with acute presentation with potential threat to life or bodily function.  Patient presents emergency department for 8 days of posterior chest pain and occasional radiation down the right arm.  Patient states seems to be positional mostly occurring at night when he lies down.  Overall the patient appears well, reassuring workup with a normal CBC with a normal white blood cell count, normal chemistry, negative troponin.  EKG shows no concerning findings.  Patient is very reassured by this however I did recommend proceeding with a 2 view chest x-ray.  Patient waited approximate 30 minutes however as the x-ray was not yet  performed he states he is ready to go home.  He is reassured by the workup and follow-up with his cardiologist.  Given the patient's negative CBC negative chemistry negative troponin normal EKG and overall reassuring physical exam.  Story is this consistent with either radicular or musculoskeletal pain.  Patient will follow-up with his cardiologist.  FINAL CLINICAL IMPRESSION(S) / ED DIAGNOSES   Chest pain    Note:  This document was prepared using Dragon voice recognition software and may include unintentional dictation errors.   Harvest Dark, MD 10/24/22 1655

## 2022-10-24 NOTE — ED Triage Notes (Addendum)
Pt here with right arm pain x8 days. Pt states he started hurting between his shoulder blades on Sun night and then the pain traveled down his arms. Pt states when he leans his head back it really hurts. Pt still able to move his fingers. Pt states it feels like a deep and achy pain. 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 Pt denies cp.

## 2022-10-24 NOTE — ED Triage Notes (Signed)
C/O upper back pain x 1 month.  Today presented to [facility] with right sided upper back pain and right arm squeezing pain x 8 days.  Sent to ED for evaluation by [facility].  Patient is AAOx3.  Skin warm and dry. NAD

## 2022-10-29 ENCOUNTER — Telehealth: Payer: Self-pay | Admitting: Cardiovascular Disease

## 2022-10-29 NOTE — Telephone Encounter (Signed)
I spoke with patient.  He states he developed right arm and pain between his shoulder blades ~ 10/16/22, which he thought developed from "binge watching tv in awkward position."  After symptoms did not resolve, he reported a the walk in clinic on 10/24/22, but due to his cardiac history her was sent to the ED for further evaluation.  Troponins were negative.  A chest x-ray was ordered, but the patient left before this was performed due to wait time.   He called today with reports of ongoing symptoms or right arm discomfort, which he feels is deep in the muscle. He still have some discomfort between his shoulder blades as well.  BP today was 183/88 (right arm) & 159/82 (left arm).   Additional readings reported below are from days prior. However, the patient does typically take his readings about 1 hour after he takes his medications.   Symptoms are reported to be worse with laying down.  I have confirmed he is still taking lisinopril 40 mg once daily & metoprolol tartrate 50 mg BID.   He states he is due to see his PCP, Dr. Ouida Sills, in the morning. I have advised that he follow up with Dr. Ouida Sills regarding his pain and elevated BP (which could be from pain). He will let us know if Dr. Ouida Sills, would like [place] to follow up with him afterward.  The patient voices understanding and is agreeable.  He was appreciative of the call back.

## 2022-10-29 NOTE — Telephone Encounter (Signed)
Pt c/o BP issue: STAT if pt c/o blurred vision, one-sided weakness or slurred speech  1. What are your last 5 BP readings?   183/88 169/82 154/81 183/83 167/78 175/83 181/86 179/94   2. Are you having any other symptoms (ex. Dizziness, headache, blurred vision, passed out)? No   3. What is your BP issue? Pt states his bp has been high and he has some pain in his right arm. He states that his right feels heavier. Pt went to ED 10/24/22.

## 2022-10-30 ENCOUNTER — Other Ambulatory Visit
Admission: RE | Admit: 2022-10-30 | Discharge: 2022-10-30 | Disposition: A | Discharge status: Home / Self Care | Payer: BC Managed Care – PPO | Source: Ambulatory Visit | Attending: Internal Medicine | Admitting: Internal Medicine

## 2022-10-30 DIAGNOSIS — R0789 Other chest pain: Secondary | ICD-10-CM | POA: Diagnosis present

## 2022-10-30 DIAGNOSIS — I25810 Atherosclerosis of coronary artery bypass graft(s) without angina pectoris: Secondary | ICD-10-CM | POA: Diagnosis present

## 2022-10-30 LAB — D-DIMER, QUANTITATIVE: D-Dimer, Quant: <0.27 ug/mL-FEU (ref 0.00–0.50)

## 2022-11-01 ENCOUNTER — Encounter: Payer: Self-pay | Admitting: Nurse Practitioner

## 2022-11-01 ENCOUNTER — Ambulatory Visit: Payer: BC Managed Care – PPO | Attending: Nurse Practitioner | Admitting: Nurse Practitioner

## 2022-11-01 VITALS — BP 122/70 | HR 60 | Ht 68.5 in | Wt 222.4 lb

## 2022-11-01 DIAGNOSIS — E782 Mixed hyperlipidemia: Secondary | ICD-10-CM

## 2022-11-01 DIAGNOSIS — I10 Essential (primary) hypertension: Secondary | ICD-10-CM | POA: Diagnosis not present

## 2022-11-01 DIAGNOSIS — I25119 Atherosclerotic heart disease of native coronary artery with unspecified angina pectoris: Secondary | ICD-10-CM

## 2022-11-01 DIAGNOSIS — M546 Pain in thoracic spine: Secondary | ICD-10-CM | POA: Diagnosis not present

## 2022-11-01 NOTE — Patient Instructions (Addendum)
Medication Instructions:  - Your physician recommends that you continue on your current medications as directed. Please refer to the Current Medication list given to you today.  *If you need a refill on your cardiac medications before your next appointment, please call your pharmacy*   Lab Work: - none ordered  If you have labs (blood work) drawn today and your tests are completely normal, you will receive your results only by: [place] (if you have MyChart) OR A paper copy in the mail If you have any lab test that is abnormal or we need to change your treatment, we will call you to review the results.   Testing/Procedures: - none ordered   Follow-Up: At [facility], you and your health needs are our priority.  As part of our continuing mission to provide you with exceptional heart care, we have created designated Provider Care Teams.  These Care Teams include your primary Cardiologist (physician) and Advanced Practice Providers (APPs -  Physician Assistants and Nurse Practitioners) who all work together to provide you with the care you need, when you need it.  We recommend signing up for the patient portal called "MyChart".  Sign up information is provided on this After Visit Summary.  MyChart is used to connect with patients for Virtual Visits (Telemedicine).  Patients are able to view lab/test results, encounter notes, upcoming appointments, etc.  Non-urgent messages can be sent to your provider as well.   To learn more about what you can do with MyChart, go to [email or web address].    Your next appointment:   3 month(s)  Provider:   You may see Ida Rogue, MD or one of the following Advanced Practice Providers on your designated Care Team:   Murray Hodgkins, NP   Other Instructions Handicap Placard Renewal Form given today

## 2022-11-01 NOTE — Progress Notes (Signed)
Office Visit    Patient Name: Todd Collins Date of Encounter: 11/01/2022  Primary Care Provider:  Kirk Ruths, MD Primary Cardiologist:  Ida Rogue, MD  Chief Complaint    72 year old male with a history of CAD status post CABG x 2, hypertension, hyperlipidemia, statin intolerance, TIA, prostate cancer, gout, and depression, who presents for follow-up related to back and right arm pain.  Past Medical History    Past Medical History:  Diagnosis Date   CAD (coronary artery disease)    a. 12/2010 s/p 2 vessel CABG x 2 @ [facility] (LIMA-LAD, SVG-PDA); b. cath 03/2011: LM 20%, mLAD 75%, pD1 100% (s/p PCI/DES), mildly diseaed LCx, mRCA 80%, dRCA 95%, patent grafts; c. 12/2015 Ex MV: ex time 7:15, max HR 153.  EF 69%, 67m lat ST  dep, no ischemia.   Depression    Diastolic dysfunction    a. 05/2022 Echo: EF 60-65%, no rwma, GrII DD, nl RV fxn, RVSP 33.192mg, mild AS.   Gout    Hyperlipidemia    Hypertension    Lyme disease    Prostate cancer ([facility]   Statin intolerance    TIA (transient ischemic attack) 07/2013   Past Surgical History:  Procedure Laterality Date   CARDIAC CATHETERIZATION  12/25/2010   CORONARY ANGIOPLASTY     stent x 1    CORONARY ARTERY BYPASS GRAFT  11/2010   [facility]   right index finger surgery      Allergies  Allergies  Allergen Reactions   Definity [Perflutren Lipid Microsphere] Other (See Comments)    Patient experiences severe low back pain for 30-45 seconds after injection   Carvedilol Itching and Swelling   Bupropion Other (See Comments)    Other reaction(s): RASH Upper extremity edema Other reaction(s): RASH Upper extremity edema Other reaction(s): RASH   Omeprazole Other (See Comments)    Other reaction(s): Other (See Comments) Caused muscle cramps Other reaction(s): Other (See Comments) Caused muscle cramps   Prednisone Other (See Comments)    Elevated blood sugar  Other reaction(s): SHORTNESS OF BREATH Elevated blood  sugars Elevated blood sugar Other reaction(s): SHORTNESS OF BREATH   Statins     Other reaction(s): Muscle Pain   Trazodone Other (See Comments)    Shortness of breath  wheezing   Trazodone And Nefazodone     Shortness of breath  wheezing    History of Present Illness    [age]ear old male with above past medical history including CAD, hypertension, hyperlipidemia, statin intolerance, TIA, prostate cancer, gout, and depression.  He previous [facility] x 2 [facility] In June 2012, he underwent PCI and drug-eluting stent placement to an occluded diagonal branch.  At that time, grafts were patent.  Most recent stress testing occurred in March 2017, which time he walked 7 minutes and 15 seconds.  He was noted to have 1 mm lateral ST segment depression throughout the study.  Imaging was negative for ischemia.  In October 2014, he suffered a TIA.  Head CT at that time was normal.  Mr. LiEhleas last in cardiology clinic in June 2023 at which time he was doing well.  In the setting of mild aortic stenosis noted on echocardiogram 2021, a repeat echo was performed and showed very mild aortic stenosis.  EF was normal at 60-65% with grade 2 diastolic dysfunction.  Patient was doing well until about mid December, when he began to have mid scapular back pain.  Pain was  worse with certain upper body position changes including hyperextension of his neck and turning his head to the right.  On December 26, the pain began radiating into his right forearm.  He also noticed right arm numbness with abduction of the right arm, that improved with adduction and internal rotation.  The pain in his back has been constant and once it began involving his arm, he became concerned that it might be related to his heart.  After about a week of ongoing symptoms, he presented to the emergency department on January 3.  Workup there was unremarkable with normal troponin despite prolonged symptoms.  He was discharged  from the ED and follow-up with his primary care provider on January 9.  Additional testing including D-dimer was normal.  A PA and lateral chest x-ray was also performed and that I cannot see the result, the patient says that this was normal and did not show any fractures.  He was advised to follow-up with cardiology secondary to ongoing pain and patient's concern about potential anginal equivalent.  He is very active at his work with the [employer].  Despite heavy exertion, he does not experience dyspnea or any worsening of his symptoms.  He denies palpitations, PND, orthopnea, dizziness, syncope, edema, or early satiety.  Home Medications    Current Outpatient Medications  Medication Sig Dispense Refill   allopurinol (ZYLOPRIM) 150 mg TABS tablet Take 150 mg by mouth daily.     amLODipine (NORVASC) 5 MG tablet Take 5 mg by mouth daily.     aspirin EC 81 MG tablet Take 81 mg by mouth daily.      Cyanocobalamin (VITAMIN B12 PO) Take 0.5 tablets by mouth daily.     lisinopril-hydrochlorothiazide (ZESTORETIC) 20-12.5 MG tablet Take 1 tablet by mouth daily.     metoprolol tartrate (LOPRESSOR) 50 MG tablet Take 1 tablet (50 mg total) by mouth 2 (two) times daily. 180 tablet 3   nitroGLYCERIN (NITROSTAT) 0.4 MG SL tablet Place 1 tablet (0.4 mg total) under the tongue every 5 (five) minutes as needed for chest pain. 25 tablet 3   Omega-3 Fatty Acids (FISH OIL) 1000 MG CAPS Take by mouth 2 (two) times daily.     tamsulosin (FLOMAX) 0.4 MG CAPS capsule Take 1 capsule (0.4 mg total) by mouth daily. 90 capsule 3   lisinopril (ZESTRIL) 40 MG tablet Take 1 tablet (40 mg total) by mouth daily. 90 tablet 3   sildenafil (REVATIO) 20 MG tablet Take 3-5 tablets 1 hour prior to intercourse as needed (Patient not taking: Reported on 11/01/2022) 60 tablet 11   No current facility-administered medications for this visit.     Review of Systems    Back and right arm pain as outlined above.  This has  been constant since mid December worse with certain upper body position changes.  He has also had some numbness of the right arm with abduction. He denies chest pain, palpitations, dyspnea, pnd, orthopnea, n, v, dizziness, syncope, edema, weight gain, or early satiety.  All other systems reviewed and are otherwise negative except as noted above.    Physical Exam    VS:  BP 122/70 (BP Location: Left Arm, Patient Position: Sitting, Cuff Size: Normal)   Pulse 60   Ht 5' 8.5" (1.74 m)   Wt 222 lb 6.4 oz (100.9 kg)   BMI 33.32 kg/m  , BMI Body mass index is 33.32 kg/m.     GEN: Well nourished, well developed, in no  acute distress. HEENT: normal. Neck: Supple, no JVD, carotid bruits, or masses. Cardiac: RRR, 2/6 syst murmur @ USB/LLSB, no rubs or gallops. No clubbing, cyanosis, edema.  Radials 2+/PT 2+ and equal bilaterally.  Respiratory:  Respirations regular and unlabored, clear to auscultation bilaterally. GI: Soft, nontender, nondistended, BS + x 4. MS: no deformity or atrophy. Skin: warm and dry, no rash. Neuro:  Strength and sensation are intact. Psych: Normal affect.  Accessory Clinical Findings    ECG personally reviewed by me today -regular sinus rhythm, 60, first-degree AV block, nonspecific T abnormalities- no acute changes.  Lab Results  Component Value Date   WBC 6.3 10/24/2022   HGB 15.5 10/24/2022   HCT 44.8 10/24/2022   MCV 85.8 10/24/2022   PLT 219 10/24/2022   Lab Results  Component Value Date   CREATININE 1.17 10/24/2022   BUN 16 10/24/2022   NA 138 10/24/2022   K 3.7 10/24/2022   CL 108 10/24/2022   CO2 22 10/24/2022   Lab Results  Component Value Date   ALT 20 12/15/2018   AST 22 12/15/2018   ALKPHOS 64 12/15/2018   BILITOT 0.9 12/15/2018   Lab Results  Component Value Date   CHOL 267 (H) 12/09/2018   HDL 39 (L) 12/09/2018   LDLCALC 176 (H) 12/09/2018   LDLDIRECT 200 (H) 12/09/2018   TRIG 258 (H) 12/09/2018   CHOLHDL 6.8 (H) 12/09/2018     Labs dated May 2023 from care everywhere  Total cholesterol 248, triglycerides 246, HDL 36.6, LDL 162  Assessment & Plan    1.  Mid scapular back pain and right arm pain/numbness: Over the past month, patient has been experiencing constant mid scapular back pain that is worse with hyperextension of his neck and turning his head to his right as well as some upper body position changes.  Since December 26, this been associated with right arm pain that he says is deep in the muscle.  This is worse with abduction of the right arm and improves with adduction and internal rotation of the right shoulder.  He was evaluated in the emergency department on January 3 with unremarkable EKG and lab work including normal troponin.  More recently, he was seen by primary care and his D-dimer was normal.  He had a chest x-ray which although I cannot see the report, patient says he was told it was normal without any evidence of fractures.  His pain remains constant and reproducible as outlined above.  He was initially concerned that this might be cardiac in origin although I think in the absence of objective evidence of ischemia with fairly atypical symptoms for angina, additional ischemic evaluation is not warranted at this time.  He also wishes to defer any ischemic evaluation.  Encouraged to follow-up with primary care as he likely requires additional imaging of his spine.  2.  Coronary artery disease: Status post two-vessel bypass in 2012 at [facility] with subsequent stenting of the first diagonal several months later.  Negative Myoview in 2017.  See #1.  He has not been have any chest pain or dyspnea and remains active without symptoms or limitations.  We did discuss potentially pursuing stress testing related to #1 however mutually agreed to defer at this time in the absence of objective evidence of ischemia with atypical symptoms.  He remains on aspirin, beta-blocker, ACE inhibitor therapy.  He is statin intolerant  secondary to myalgias.  His LDL cholesterol was 162 in May 2023.  He only takes  fish oil.  I will reach back out to the patient to see if he be willing to consider Repatha.  3.  Essential hypertension: Stable on beta-blocker and ACE inhibitor therapy.  4.  Hyperlipidemia: See #2.  LDL of 162 in May 2023.  Statin intolerant.  Considering Repatha-will reach back out to patient.  5.  Disposition: Follow-up in clinic in 3 months or sooner if necessary.  Murray Hodgkins, NP 11/01/2022, 4:58 PM

## 2022-11-06 ENCOUNTER — Ambulatory Visit: Payer: BC Managed Care – PPO | Admitting: Urology

## 2022-11-06 ENCOUNTER — Encounter: Payer: Self-pay | Admitting: Urology

## 2022-11-06 VITALS — BP 151/72 | HR 64 | Ht 68.5 in | Wt 220.0 lb

## 2022-11-06 DIAGNOSIS — C61 Malignant neoplasm of prostate: Secondary | ICD-10-CM | POA: Diagnosis not present

## 2022-11-06 DIAGNOSIS — R3912 Poor urinary stream: Secondary | ICD-10-CM

## 2022-11-06 DIAGNOSIS — N400 Enlarged prostate without lower urinary tract symptoms: Secondary | ICD-10-CM

## 2022-11-06 DIAGNOSIS — N401 Enlarged prostate with lower urinary tract symptoms: Secondary | ICD-10-CM

## 2022-11-06 LAB — BLADDER SCAN AMB NON-IMAGING

## 2022-11-06 NOTE — Progress Notes (Signed)
I, DeAsia L Maxie,acting as a scribe for Hollice Espy, MD.,have documented all relevant documentation on the behalf of Hollice Espy, MD,as directed by  Hollice Espy, MD while in the presence of Hollice Espy, MD.   I, Amy L Pierron,acting as a scribe for Hollice Espy, MD.,have documented all relevant documentation on the behalf of Hollice Espy, MD,as directed by  Hollice Espy, MD while in the presence of Hollice Espy, MD.   11/06/22 3:08 PM   Todd Collins [birth year] [id number]  Referring provider: Kirk Ruths, MD [facility] [place],  [place] [id number]  Chief Complaint  Patient presents with   Benign Prostatic Hypertrophy   Elevated PSA    Follow up    HPI: 72 year-old male with a personal history of prostate cancer low risk on active surveillance, BPH, and erectile dysfunction who returns earlier than scheduled to discuss medical management.  Most recent PSA was 4.4 on 10/09/22 which is stable.   He's no longer taking Flomax. Notably his TRUS volume was 63 cc.  He notes that in July a week after taking Flomax he noticed some visual changes described as a "white out" which he stopped taking the medication.  He did not have any associated dizziness.  He did not feel lightheaded when he stood up.  He is not sure his symptoms were associated with the medication necessarily.  He reports a weak stream. He is willing to try Flomax again to see if it helps.    Results for orders placed or performed in visit on 11/06/22  Bladder Scan (Post Void Residual) in office  Result Value Ref Range   Scan Result 23m      IPSS     Row Name 11/06/22 1300         International Prostate Symptom Score   How often have you had the sensation of not emptying your bladder? Less than 1 in 5     How often have you had to urinate less than every two hours? Not at All     How often have you found you stopped and started again several times when  you urinated? Less than 1 in 5 times     How often have you found it difficult to postpone urination? Less than half the time     How often have you had a weak urinary stream? Almost always     How often have you had to strain to start urination? Not at All     How many times did you typically get up at night to urinate? 2 Times     Total IPSS Score 11       Quality of Life due to urinary symptoms   If you were to spend the rest of your life with your urinary condition just the way it is now how would you feel about that? Mostly Satisfied              Score:  1-7 Mild 8-19 Moderate 20-35 Severe    PMH: Past Medical History:  Diagnosis Date   CAD (coronary artery disease)    a. 12/2010 s/p 2 vessel CABG x 2 @ [facility] (LIMA-LAD, SVG-PDA); b. cath 03/2011: LM 20%, mLAD 75%, pD1 100% (s/p PCI/DES), mildly diseaed LCx, mRCA 80%, dRCA 95%, patent grafts; c. 12/2015 Ex MV: ex time 7:15, max HR 153.  EF 69%, 1109mlat ST  dep, no ischemia.   Depression  Diastolic dysfunction    a. 05/2022 Echo: EF 60-65%, no rwma, GrII DD, nl RV fxn, RVSP 33.29mHg, mild AS.   Gout    Hyperlipidemia    Hypertension    Lyme disease    Prostate cancer ([facility]    Statin intolerance    TIA (transient ischemic attack) 07/2013    Surgical History: Past Surgical History:  Procedure Laterality Date   CARDIAC CATHETERIZATION  12/25/2010   CORONARY ANGIOPLASTY     stent x 1    CORONARY ARTERY BYPASS GRAFT  11/2010   [facility]   right index finger surgery      Home Medications:  Allergies as of 11/06/2022       Reactions   Definity [perflutren Lipid Microsphere] Other (See Comments)   Patient experiences severe low back pain for 30-45 seconds after injection   Carvedilol Itching, Swelling   Bupropion Other (See Comments)   Other reaction(s): RASH Upper extremity edema Other reaction(s): RASH Upper extremity edema Other reaction(s): RASH   Omeprazole Other (See Comments)   Other reaction(s): Other (See  Comments) Caused muscle cramps Other reaction(s): Other (See Comments) Caused muscle cramps   Prednisone Other (See Comments)   Elevated blood sugar Other reaction(s): SHORTNESS OF BREATH Elevated blood sugars Elevated blood sugar Other reaction(s): SHORTNESS OF BREATH   Statins    Other reaction(s): Muscle Pain   Trazodone Other (See Comments)   Shortness of breath  wheezing   Trazodone And Nefazodone    Shortness of breath  wheezing        Medication List        Accurate as of November 06, 2022  3:08 PM. If you have any questions, ask your nurse or doctor.          STOP taking these medications    sildenafil 20 MG tablet Commonly known as: REVATIO Stopped by: AHollice Espy MD       TAKE these medications    allopurinol 150 mg Tabs tablet Commonly known as: ZYLOPRIM Take 150 mg by mouth daily.   amLODipine 5 MG tablet Commonly known as: NORVASC Take 5 mg by mouth daily.   aspirin EC 81 MG tablet Take 81 mg by mouth daily.   Fish Oil 1000 MG Caps Take by mouth 2 (two) times daily.   lisinopril-hydrochlorothiazide 20-12.5 MG tablet Commonly known as: ZESTORETIC Take 1 tablet by mouth daily.   metoprolol tartrate 50 MG tablet Commonly known as: LOPRESSOR Take 1 tablet (50 mg total) by mouth 2 (two) times daily.   nitroGLYCERIN 0.4 MG SL tablet Commonly known as: NITROSTAT Place 1 tablet (0.4 mg total) under the tongue every 5 (five) minutes as needed for chest pain.   tamsulosin 0.4 MG Caps capsule Commonly known as: FLOMAX Take 1 capsule (0.4 mg total) by mouth daily.   VITAMIN B12 PO Take 0.5 tablets by mouth daily.        Allergies:  Allergies  Allergen Reactions   Definity [Perflutren Lipid Microsphere] Other (See Comments)    Patient experiences severe low back pain for 30-45 seconds after injection   Carvedilol Itching and Swelling   Bupropion Other (See Comments)    Other reaction(s): RASH Upper extremity edema Other  reaction(s): RASH Upper extremity edema Other reaction(s): RASH   Omeprazole Other (See Comments)    Other reaction(s): Other (See Comments) Caused muscle cramps Other reaction(s): Other (See Comments) Caused muscle cramps   Prednisone Other (See Comments)    Elevated blood sugar  Other reaction(s):  SHORTNESS OF BREATH Elevated blood sugars Elevated blood sugar Other reaction(s): SHORTNESS OF BREATH   Statins     Other reaction(s): Muscle Pain   Trazodone Other (See Comments)    Shortness of breath  wheezing   Trazodone And Nefazodone     Shortness of breath  wheezing    Family History: Family History  Problem Relation Age of Onset   Heart attack Father 19    Social History:  reports that he quit smoking about 44 years ago. His smoking use included cigarettes. He has been exposed to tobacco smoke. He has never used smokeless tobacco. He reports current alcohol use of about 2.0 standard drinks of alcohol per week. He reports that he does not use drugs.   Physical Exam: BP (!) 151/72   Pulse 64   Ht 5' 8.5" (1.74 m)   Wt 220 lb (99.8 kg)   BMI 32.96 kg/m   Constitutional:  Alert and oriented, No acute distress. HEENT: [place] AT, moist mucus membranes.  Trachea midline, no masses. Neurologic: Grossly intact, no focal deficits, moving all 4 extremities. Psychiatric: Normal mood and affect.   Assessment & Plan:    BPH with weak stream  - Unclear whether or not this "white out" was related to the Flomax,could be other issues. He's not sure if it was effective, he wasn't on it long enough to know. We discussed a repeat trial of this medication. We also discussed alternative options, including change in medication versus consideration of Urolift. He was given literature about Urolift today and he is fairly interested in that. If he does want to proceed with that, he'll need a cystoscopy.  2. Prostate Cancer  - PSA is stable on active surveillance. We'll repeat another PSA  with rectal exam in six months, consider further diagnostic intervention if his PSA rises.   Return in about 6 months (around 05/07/2023) for PSA with rectal exam.  I have reviewed the above documentation for accuracy and completeness, and I agree with the above.   Hollice Espy, MD  [facility] [address], [place], [place] [address] [phone]

## 2022-11-13 ENCOUNTER — Other Ambulatory Visit: Payer: Self-pay | Admitting: Internal Medicine

## 2022-11-13 DIAGNOSIS — M4807 Spinal stenosis, lumbosacral region: Secondary | ICD-10-CM

## 2022-11-24 ENCOUNTER — Ambulatory Visit
Admission: RE | Admit: 2022-11-24 | Discharge: 2022-11-24 | Disposition: A | Discharge status: Home / Self Care | Payer: BC Managed Care – PPO | Source: Ambulatory Visit | Attending: Internal Medicine | Admitting: Internal Medicine

## 2022-11-24 DIAGNOSIS — M4807 Spinal stenosis, lumbosacral region: Secondary | ICD-10-CM

## 2023-02-03 NOTE — Progress Notes (Unsigned)
Cardiology Office Note  Date:  02/04/2023   ID:  Todd Collins, DOB 10-28-1950, MRN [id number]  PCP:  Todd Regulus, MD   Chief Complaint  Patient presents with   3 month follow up     Patient c/o shortness of breath with walking up an incline, cramping in legs and neck, feet feel as if walking on rocks. Medications reviewed by the patient verbally.     HPI:  Todd Collins is a 72 year old gentleman  with history of  coronary artery disease,  catheterization in March 2012 showing 50% mid LAD disease, 90% disease of a small diagonal, 50% proximal RCA disease, 70% mid RCA disease, 60-70% distal RCA disease, CABG x2 vessel, LIMA to the LAD, vein graft to the PDA 02/02/2011  PCI of native D1   2012 several months later depression, Hyperlipidemia with statin intolerance, declined other modalities who presents for routine followup of his coronary artery disease and hypertension  Last seen by myself in clinic May 2022 Seen by one of our providers January 2024 January 2024 f reported back pain, arm pain  In follow-up today, 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 Lost brother to covid    covid again, March 2022,  Medication intolerance  not been taking Amlodipine caused "chest pain".   intolerance to HCTZ but cannot recall side effect.  Coreg: now with allergy, itching/mouth puffy, stopped the pill zetia : itching , not taking regular Does not want statin, caused muscle pain, shoulders   ER 11/2019, headache and concern for high BP Cardiac work-up negative  Echo 03/2019 left ventricle has normal systolic  function, with an ejection  fraction of 55-60%. Mild-moderate stenosis of the aortic valve.   Previous vision issue"squiggly in both eyes", "like a kalidescope" Lasted 1 hour No further episodes, back to normal now, no other neurologic issues   Reports having a TIA in October 2014, blurry vision, were findings difficulty. CT scan of the head was normal. Echocardiogram and Holter performed. Holter showed normal sinus rhythm    PMH:   has a past medical history of CAD (coronary artery disease), Depression, Diastolic dysfunction, Gout, Hyperlipidemia, Hypertension, Lyme disease, Prostate cancer, Statin intolerance, and TIA (transient ischemic attack) (07/2013).  PSH:    Past Surgical History:  Procedure Laterality Date   CARDIAC CATHETERIZATION  12/25/2010   CORONARY ANGIOPLASTY     stent x 1    CORONARY ARTERY BYPASS GRAFT  11/2010   [facility]   right index finger surgery      Current Outpatient Medications  Medication Sig Dispense Refill   allopurinol (ZYLOPRIM) 150 mg TABS tablet Take 150 mg by mouth daily.     amLODipine (NORVASC) 5 MG tablet Take 5 mg by mouth daily.     aspirin EC 81 MG tablet Take 81 mg by mouth daily.      Cyanocobalamin (VITAMIN B12 PO) Take 1,000 mcg by mouth daily.     lisinopril-hydrochlorothiazide (ZESTORETIC) 20-12.5 MG tablet Take 1 tablet by mouth daily.     metoprolol tartrate (LOPRESSOR) 50 MG tablet Take 1 tablet (50 mg total) by mouth 2 (two) times daily. 180 tablet 3   nitroGLYCERIN (  NITROSTAT) 0.4 MG SL tablet Place 1 tablet (0.4 mg total) under the tongue every 5 (five) minutes as needed for chest pain. 25 tablet 3   Omega-3 Fatty Acids (FISH OIL) 1000 MG CAPS Take by mouth 2 (two) times daily.     tamsulosin (FLOMAX) 0.4 MG CAPS capsule Take 1 capsule (0.4 mg total) by mouth daily. 90 capsule 3   No current facility-administered medications for this visit.    Allergies:   Definity [perflutren lipid microsphere], Carvedilol, Bupropion, Omeprazole,  Prednisone, Statins, Trazodone, and Trazodone and nefazodone   Social History:  The patient  reports that he quit smoking about 45 years ago. His smoking use included cigarettes. He has been exposed to tobacco smoke. He has never used smokeless tobacco. He reports current alcohol use of about 2.0 standard drinks of alcohol per week. He reports that he does not use drugs.   Family History:   family history includes Heart attack (age of onset: 60) in his father.    Review of Systems: Review of Systems  Constitutional: Negative.   HENT: Negative.    Respiratory: Negative.    Cardiovascular: Negative.   Gastrointestinal: Negative.   Musculoskeletal: Negative.   Neurological: Negative.   Psychiatric/Behavioral: Negative.    All other systems reviewed and are negative.   PHYSICAL EXAM: VS:  BP (!) 120/58 (BP Location: Left Arm, Patient Position: Sitting, Cuff Size: Normal)   Pulse 75   Ht 5' 8.5" (1.74 m)   Wt 223 lb 2 oz (101.2 kg)   SpO2 95%   BMI 33.43 kg/m  , BMI Body mass index is 33.43 kg/m. Constitutional:  oriented to person, place, and time. No distress.  HENT:  Head: Grossly normal Eyes:  no discharge. No scleral icterus.  Neck: No JVD, no carotid bruits  Cardiovascular: Regular rate and rhythm, 1/6 SEM RSB Pulmonary/Chest: Clear to auscultation bilaterally, no wheezes or rails Abdominal: Soft.  no distension.  no tenderness.  Musculoskeletal: Normal range of motion Neurological:  normal muscle tone. Coordination normal. No atrophy Skin: Skin warm and dry Psychiatric: normal affect, pleasant  Recent Labs: 10/24/2022: BUN 16; Creatinine, Ser 1.17; Hemoglobin 15.5; Platelets 219; Potassium 3.7; Sodium 138    Lipid Panel Lab Results  Component Value Date   CHOL 267 (H) 12/09/2018   HDL 39 (L) 12/09/2018   LDLCALC 176 (H) 12/09/2018   TRIG 258 (H) 12/09/2018      Wt Readings from Last 3 Encounters:  02/04/23 223 lb 2 oz (101.2 kg)  11/06/22 220 lb (99.8 kg)   11/01/22 222 lb 6.4 oz (100.9 kg)     ASSESSMENT AND PLAN:  Coronary artery disease involving coronary bypass graft with angina pectoris, unspecified whether native or transplanted heart (HCC) Currently with no symptoms of angina. No further workup at this time. Continue current medication regimen. Currently does not want statins, Zetia, Repatha, Leqvio Recommend he research the Repatha and Leqvio.   Referral could be placed to hyperlipidemia clinic if he desires  Mixed hyperlipidemia Prior side effects on statins, Zetia Does not want PCSK9 inhibitor Currently not interested in [facility]  Essential hypertension Blood pressure is well controlled on today's visit. No changes made to the medications.  S/P CABG x 2 Denies anginal symptoms Does not want "shots" for cholesterol   shortness of breath Recommend walking program for conditioning Reports symptoms are stable, does not feel he has anginal symptoms concerning for ischemia Active at baseline, works with heavy equipment  Mild aortic valve stenosis Minimal  murmur on today's visit    Total encounter time more than 30 minutes  Greater than 50% was spent in counseling and coordination of care with the patient   No orders of the defined types were placed in this encounter.    Signed, Dossie Arbour, M.D., Ph.D. 02/04/2023  [facility] [place], [place] [phone]

## 2023-02-04 ENCOUNTER — Ambulatory Visit: Payer: BC Managed Care – PPO | Attending: Cardiovascular Disease | Admitting: Cardiovascular Disease

## 2023-02-04 ENCOUNTER — Encounter: Payer: Self-pay | Admitting: Cardiovascular Disease

## 2023-02-04 VITALS — BP 120/58 | HR 75 | Ht 68.5 in | Wt 223.1 lb

## 2023-02-04 DIAGNOSIS — I25718 Atherosclerosis of autologous vein coronary artery bypass graft(s) with other forms of angina pectoris: Secondary | ICD-10-CM

## 2023-02-04 DIAGNOSIS — E782 Mixed hyperlipidemia: Secondary | ICD-10-CM | POA: Diagnosis not present

## 2023-02-04 DIAGNOSIS — Z951 Presence of aortocoronary bypass graft: Secondary | ICD-10-CM | POA: Diagnosis not present

## 2023-02-04 DIAGNOSIS — I10 Essential (primary) hypertension: Secondary | ICD-10-CM | POA: Diagnosis not present

## 2023-02-04 MED ORDER — AMLODIPINE BESYLATE 5 MG PO TABS: 5.0000 mg | ORAL_TABLET | Freq: Every day | ORAL | 3 refills | Status: DC

## 2023-02-04 MED ORDER — LISINOPRIL-HYDROCHLOROTHIAZIDE 20-12.5 MG PO TABS: 1.0000 | ORAL_TABLET | Freq: Every day | ORAL | 3 refills | Status: DC

## 2023-02-04 MED ORDER — METOPROLOL TARTRATE 50 MG PO TABS: 50.0000 mg | ORAL_TABLET | Freq: Two times a day (BID) | ORAL | 3 refills | Status: DC

## 2023-02-04 NOTE — Patient Instructions (Addendum)
Medication Instructions:  No changes  Read about every 2 weeks: repatha Read about every 6 month: leqvio  If you need a refill on your cardiac medications before your next appointment, please call your pharmacy.   Lab work: No new labs needed  Testing/Procedures: No new testing needed  Follow-Up: At [facility], you and your health needs are our priority.  As part of our continuing mission to provide you with exceptional heart care, we have created designated Provider Care Teams.  These Care Teams include your primary Cardiologist (physician) and Advanced Practice Providers (APPs -  Physician Assistants and Nurse Practitioners) who all work together to provide you with the care you need, when you need it.  You will need a follow up appointment in 12 months  Providers on your designated Care Team:   Nicolasa Ducking, NP Eula Listen, PA-C Cadence Fransico Michael, [place]  COVID-19 Vaccine Information can be found at: [email or web address] For questions related to vaccine distribution or appointments, please email [email or web address] or call [phone].

## 2023-03-04 ENCOUNTER — Other Ambulatory Visit
Admission: RE | Admit: 2023-03-04 | Discharge: 2023-03-04 | Disposition: A | Discharge status: Home / Self Care | Payer: BC Managed Care – PPO | Source: Ambulatory Visit | Attending: Internal Medicine | Admitting: Internal Medicine

## 2023-03-04 DIAGNOSIS — I10 Essential (primary) hypertension: Secondary | ICD-10-CM | POA: Diagnosis present

## 2023-03-04 DIAGNOSIS — I25810 Atherosclerosis of coronary artery bypass graft(s) without angina pectoris: Secondary | ICD-10-CM | POA: Insufficient documentation

## 2023-03-04 DIAGNOSIS — Z0000 Encounter for general adult medical examination without abnormal findings: Secondary | ICD-10-CM | POA: Diagnosis present

## 2023-03-04 LAB — D-DIMER, QUANTITATIVE: D-Dimer, Quant: 0.47 ug/mL-FEU (ref 0.00–0.50)

## 2023-04-01 ENCOUNTER — Other Ambulatory Visit: Payer: BC Managed Care – PPO

## 2023-04-01 DIAGNOSIS — C61 Malignant neoplasm of prostate: Secondary | ICD-10-CM

## 2023-04-02 LAB — PSA: Prostate Specific Ag, Serum: 5.6 ng/mL — ABNORMAL HIGH (ref 0.0–4.0)

## 2023-04-03 ENCOUNTER — Ambulatory Visit: Payer: BC Managed Care – PPO | Admitting: Urology

## 2023-04-05 ENCOUNTER — Ambulatory Visit: Payer: BC Managed Care – PPO | Admitting: Urology

## 2023-04-05 VITALS — BP 120/69 | HR 57 | Ht 68.5 in | Wt 219.4 lb

## 2023-04-05 DIAGNOSIS — N400 Enlarged prostate without lower urinary tract symptoms: Secondary | ICD-10-CM

## 2023-04-05 DIAGNOSIS — C61 Malignant neoplasm of prostate: Secondary | ICD-10-CM

## 2023-04-05 LAB — BLADDER SCAN AMB NON-IMAGING: Scan Result: 60

## 2023-04-05 NOTE — Progress Notes (Signed)
Marcelle Overlie Plume,acting as a scribe for Vanna Scotland, MD.,have documented all relevant documentation on the behalf of Vanna Scotland, MD,as directed by  Vanna Scotland, MD while in the presence of Vanna Scotland, MD.  04/05/2023 11:55 AM   Todd Collins 24-Mar-1951 [id number]  Referring provider: Lauro Regulus, MD [address] [facility] [place],  [place] [address]  Chief Complaint  Patient presents with   Benign Prostatic Hypertrophy    HPI: 72 year-old male who returns for a routine annual follow up. However, he was seen in the interim just back in 10/2022. He has a personal history of BPH with LUTS as well as elevated PSA. His TRUS volume is 63 cc. He had stopped taking his Flomax due to visual changes that he describes as "a white out".  He did not have any associated dizziness. He did not feel lightheaded when he stood up. At his last visit, I urged him to resume this medication as it was unlikely the cause. He was going to try it again. We also discussed other additional intervention including Urolift if he tried and fails this medication. He also has a personal history of prostate cancer on active surveillance, low-risk. He was diagnosed with Gleason 3+3 disease in 02/2022. He had 3/4 of Gleason 3+3, involving up to 25% of the tissue, all on the right lateral.   His most recent PSA on 04/01/2023 was 5.6.   Today, he notes that even though Flomax improved his urinary symptoms, he is unable to tolerate the medication as he experienced significant side effects, including difficulty walking and visual disturbances in bright sunlight, leading him to discontinue the medication.   Prostate Specific Ag, Serum  Latest Ref Rng 0.0 - 4.0 ng/mL  03/15/2022 4.7 (H)   10/09/2022 4.4 (H)   04/01/2023 5.6 (H)      Results for orders placed or performed in visit on 04/05/23  Bladder Scan (Post Void Residual) in office  Result Value Ref Range   Scan Result 60 ml      IPSS     Row Name 04/05/23 1100         International Prostate Symptom Score   How often have you had the sensation of not emptying your bladder? Not at All     How often have you had to urinate less than every two hours? Not at All     How often have you found you stopped and started again several times when you urinated? Less than half the time     How often have you found it difficult to postpone urination? Not at All     How often have you had a weak urinary stream? Almost always     How often have you had to strain to start urination? Not at All     How many times did you typically get up at night to urinate? 2 Times     Total IPSS Score 9       Quality of Life due to urinary symptoms   If you were to spend the rest of your life with your urinary condition just the way it is now how would you feel about that? Mixed              Score:  1-7 Mild 8-19 Moderate 20-35 Severe    PMH: Past Medical History:  Diagnosis Date   CAD (coronary artery disease)    a. 12/2010 s/p 2 vessel  CABG x 2 @ [facility] (LIMA-LAD, SVG-PDA); b. cath 03/2011: LM 20%, mLAD 75%, pD1 100% (s/p PCI/DES), mildly diseaed LCx, mRCA 80%, dRCA 95%, patent grafts; c. 12/2015 Ex MV: ex time 7:15, max HR 153.  EF 69%, 1mm lat ST  dep, no ischemia.   Depression    Diastolic dysfunction    a. 05/2022 Echo: EF 60-65%, no rwma, GrII DD, nl RV fxn, RVSP 33.42mmHg, mild AS.   Gout    Hyperlipidemia    Hypertension    Lyme disease    Prostate cancer (HCC)    Statin intolerance    TIA (transient ischemic attack) 07/2013    Surgical History: Past Surgical History:  Procedure Laterality Date   CARDIAC CATHETERIZATION  12/25/2010   CORONARY ANGIOPLASTY     stent x 1    CORONARY ARTERY BYPASS GRAFT  11/2010   [facility]   right index finger surgery      Home Medications:  Allergies as of 04/05/2023       Reactions   Definity [perflutren Lipid Microsphere] Other (See Comments)   Patient experiences severe low back  pain for 30-45 seconds after injection   Carvedilol Itching, Swelling   Bupropion Other (See Comments)   Other reaction(s): RASH Upper extremity edema Other reaction(s): RASH Upper extremity edema Other reaction(s): RASH   Omeprazole Other (See Comments)   Other reaction(s): Other (See Comments) Caused muscle cramps Other reaction(s): Other (See Comments) Caused muscle cramps   Prednisone Other (See Comments)   Elevated blood sugar Other reaction(s): SHORTNESS OF BREATH Elevated blood sugars Elevated blood sugar Other reaction(s): SHORTNESS OF BREATH   Statins    Other reaction(s): Muscle Pain   Trazodone Other (See Comments)   Shortness of breath  wheezing   Trazodone And Nefazodone    Shortness of breath  wheezing        Medication List        Accurate as of April 05, 2023 11:55 AM. If you have any questions, ask your nurse or doctor.          STOP taking these medications    tamsulosin 0.4 MG Caps capsule Commonly known as: FLOMAX Stopped by: Vanna Scotland, MD       TAKE these medications    allopurinol 150 mg Tabs tablet Commonly known as: ZYLOPRIM Take 150 mg by mouth daily.   amLODipine 5 MG tablet Commonly known as: NORVASC Take 1 tablet (5 mg total) by mouth daily.   aspirin EC 81 MG tablet Take 81 mg by mouth daily.   Fish Oil 1000 MG Caps Take by mouth 2 (two) times daily.   lisinopril-hydrochlorothiazide 20-12.5 MG tablet Commonly known as: ZESTORETIC Take 1 tablet by mouth daily.   metoprolol tartrate 50 MG tablet Commonly known as: LOPRESSOR Take 1 tablet (50 mg total) by mouth 2 (two) times daily.   nitroGLYCERIN 0.4 MG SL tablet Commonly known as: NITROSTAT Place 1 tablet (0.4 mg total) under the tongue every 5 (five) minutes as needed for chest pain.   VITAMIN B12 PO Take 1,000 mcg by mouth daily.        Allergies:  Allergies  Allergen Reactions   Definity [Perflutren Lipid Microsphere] Other (See Comments)     Patient experiences severe low back pain for 30-45 seconds after injection   Carvedilol Itching and Swelling   Bupropion Other (See Comments)    Other reaction(s): RASH Upper extremity edema Other reaction(s): RASH Upper extremity edema Other reaction(s): RASH   Omeprazole Other (  See Comments)    Other reaction(s): Other (See Comments) Caused muscle cramps Other reaction(s): Other (See Comments) Caused muscle cramps   Prednisone Other (See Comments)    Elevated blood sugar  Other reaction(s): SHORTNESS OF BREATH Elevated blood sugars Elevated blood sugar Other reaction(s): SHORTNESS OF BREATH   Statins     Other reaction(s): Muscle Pain   Trazodone Other (See Comments)    Shortness of breath  wheezing   Trazodone And Nefazodone     Shortness of breath  wheezing    Family History: Family History  Problem Relation Age of Onset   Heart attack Father 53    Social History:  reports that he quit smoking about 45 years ago. His smoking use included cigarettes. He has been exposed to tobacco smoke. He has never used smokeless tobacco. He reports current alcohol use of about 2.0 standard drinks of alcohol per week. He reports that he does not use drugs.   Physical Exam: BP 120/69   Pulse (!) 57   Ht 5' 8.5" (1.74 m)   Wt 219 lb 6 oz (99.5 kg)   BMI 32.87 kg/m   Constitutional:  Alert and oriented, No acute distress. HEENT: [place] AT, moist mucus membranes.  Trachea midline, no masses. GU: Large prostate, smooth, no nodularity Neurologic: Grossly intact, no focal deficits, moving all 4 extremities. Psychiatric: Normal mood and affect.  Assessment & Plan:    1. Prostate cancer - Prostate MRI in light of his rising PSA - Will consider the need for a fusion biopsy based on these results - Will also trend his PSA with a follow up in 6 months   2. BPH with LUTS - Discontinue Flomax due to intolerable side effects. - Discussed alternative treatments, including finasteride,  which requires six months to assess efficacy and has potential to shrink the prostate. - He is hesitant about finasteride due to its impact on PSA levels and the need for long-term use. - Consider UroLift procedure; cystoscopy to determine candidacy. - Provided him with educational pamphlet on UroLift.  Return in about 1 month (around 05/05/2023) for cysto w/ MRI prior .  [facility] [address], [address] [place], [place] [address] [phone]

## 2023-04-09 ENCOUNTER — Other Ambulatory Visit: Payer: BC Managed Care – PPO

## 2023-04-16 ENCOUNTER — Telehealth: Payer: Self-pay | Admitting: Urology

## 2023-04-16 NOTE — Telephone Encounter (Signed)
Pt called and said when scheduling (at [facility]) called to schedule his MRI, they told him to call our office and have [place] call scheduling because they needed some information about his biopsy.  Scheduling # is [phone]

## 2023-04-17 NOTE — Telephone Encounter (Signed)
Called [facility] scheduler for clarification and was told they did not see any notes about needing extra information. Called patient and advised him of this, MRI already scheduled for 04/29/23, patient states it was a scheduling department that called him but not [facility]. He will take a look and see if he has any extra information on his phone about this from them and will let me know. Otherwise keep appointment as scheduled.

## 2023-04-29 ENCOUNTER — Ambulatory Visit
Admission: RE | Admit: 2023-04-29 | Discharge: 2023-04-29 | Disposition: A | Discharge status: Home / Self Care | Payer: BC Managed Care – PPO | Source: Ambulatory Visit | Attending: Urology | Admitting: Urology

## 2023-04-29 DIAGNOSIS — C61 Malignant neoplasm of prostate: Secondary | ICD-10-CM | POA: Insufficient documentation

## 2023-04-29 MED ORDER — GADOBUTROL 1 MMOL/ML IV SOLN
9.0000 mL | Freq: Once | INTRAVENOUS | Status: AC | PRN
  Administered 2023-04-29: 9 mL via INTRAVENOUS

## 2023-05-10 ENCOUNTER — Other Ambulatory Visit: Payer: Self-pay | Admitting: Urology

## 2023-05-10 ENCOUNTER — Ambulatory Visit: Payer: BC Managed Care – PPO | Admitting: Urology

## 2023-05-10 ENCOUNTER — Other Ambulatory Visit
Admission: RE | Admit: 2023-05-10 | Discharge: 2023-05-10 | Disposition: A | Discharge status: Home / Self Care | Payer: BC Managed Care – PPO | Attending: Urology | Admitting: Urology

## 2023-05-10 ENCOUNTER — Encounter: Payer: Self-pay | Admitting: Urology

## 2023-05-10 VITALS — BP 131/73 | HR 69 | Ht 68.5 in | Wt 219.0 lb

## 2023-05-10 DIAGNOSIS — N400 Enlarged prostate without lower urinary tract symptoms: Secondary | ICD-10-CM | POA: Diagnosis not present

## 2023-05-10 DIAGNOSIS — C61 Malignant neoplasm of prostate: Secondary | ICD-10-CM

## 2023-05-10 DIAGNOSIS — R3912 Poor urinary stream: Secondary | ICD-10-CM

## 2023-05-10 LAB — URINALYSIS, COMPLETE (UACMP) WITH MICROSCOPIC
Bilirubin Urine: NEGATIVE
Glucose, UA: NEGATIVE mg/dL
Hgb urine dipstick: NEGATIVE
Ketones, ur: NEGATIVE mg/dL
Leukocytes,Ua: NEGATIVE
Nitrite: NEGATIVE
Protein, ur: NEGATIVE mg/dL
Specific Gravity, Urine: 1.02 (ref 1.005–1.030)
pH: 5 (ref 5.0–8.0)

## 2023-05-10 MED ORDER — LIDOCAINE HCL URETHRAL/MUCOSAL 2 % EX GEL
1.0000 | Freq: Once | CUTANEOUS | Status: AC
  Administered 2023-05-10: 1 via URETHRAL

## 2023-05-10 NOTE — Progress Notes (Signed)
   05/10/23  CC:  Chief Complaint  Patient presents with   Cysto    HPI: 72 year old male with a personal history of low risk prostate cancer active surveillance with urinary symptoms primarily weak stream and nocturia.  His PSA has risen slightly.  He underwent a prostate MRI that showed a 44 g prostate.  He has not a PI-RADS 3 lesion in the posterior lateral peripheral zone on the right and mid gland.  He also had an incidental borderline right external iliac lymph node that was not overtly pathologic as well as a left indirect hernia which is asymptomatic.  He does report that he was diagnosed with sleep apnea but never proceeded with treatment for this.  Blood pressure 131/73, pulse 69, height 5' 8.5" (1.74 m), weight 219 lb (99.3 kg). NED. A&Ox3.   No respiratory distress   Abd soft, NT, ND Normal phallus with bilateral descended testicles  Cystoscopy Procedure Note  Patient identification was confirmed, informed consent was obtained, and patient was prepped using Betadine solution.  Lidocaine jelly was administered per urethral meatus.     Pre-Procedure: - Inspection reveals a normal caliber ureteral meatus.  Procedure: The flexible cystoscope was introduced without difficulty - No urethral strictures/lesions are present. - Enlarged prostate bilobar - Normal bladder neck - Bilateral ureteral orifices identified - Bladder mucosa  reveals no ulcers, tumors, or lesions - No bladder stones - No trabeculation  Retroflexion shows very subtle intravesical protrusion but no median lobe   Post-Procedure: - Patient tolerated the procedure well  Prostate were reviewed  Assessment/ Plan:  1. Benign prostatic hyperplasia, unspecified whether lower urinary tract symptoms present See below - lidocaine (XYLOCAINE) 2 % jelly 1 Application - PSA; Future  2. Weak urinary stream Currently on Flomax, may be interested in outlet procedure  In light of #3, we will hold off on  proceeding with UroLift although he does appear to be a good candidate.  In further discussion today, it does appear that many of his irritative urinary symptoms are nighttime and he does have untreated sleep apnea.  I encouraged him to follow-up with this in the interim with his PCP as this may improve at least that portion of his symptoms.  If his PSA is trending back downward, we will plan to proceed with UroLift.  Otherwise, plan as outlined below. - lidocaine (XYLOCAINE) 2 % jelly 1 Application  3. Prostate cancer [facility]) Prostate MRI fairly equivocal  Plan to repeat his PSA in 3 months, the PSA is going up, he agrees to a fusion biopsy - PSA; Future  F/u 3 months with PSA prior  Vanna Scotland, MD

## 2023-07-24 ENCOUNTER — Telehealth: Payer: Self-pay | Admitting: Cardiovascular Disease

## 2023-07-24 NOTE — Telephone Encounter (Signed)
   Disp Refills Start End   lisinopril-hydrochlorothiazide (ZESTORETIC) 20-12.5 MG tablet 90 tablet 3 02/04/2023 02/04/2024   Sig - Route: Take 1 tablet by mouth daily. - Oral   Sent to pharmacy as: lisinopril-hydrochlorothiazide (ZESTORETIC) 20-12.5 MG tablet   E-Prescribing Status: Receipt confirmed by pharmacy (02/04/2023  7:40 PM EDT)

## 2023-07-24 NOTE — Telephone Encounter (Signed)
I called [facility] and spoke with the pharmacy staff to inquire and see if the patient had anymore refills remaining. The pharmacy staff informed me that the patient has refills remaining and will have prescription ready for pick up on Friday.

## 2023-07-24 NOTE — Telephone Encounter (Signed)
*  STAT* If patient is at the pharmacy, call can be transferred to refill team.   1. Which medications need to be refilled? (please list name of each medication and dose if known) lisinopril-hydrochlorothiazide (ZESTORETIC) 20-12.5 MG tablet metoprolol tartrate (LOPRESSOR) 50 MG tablet  2. Which pharmacy/location (including street and city if local pharmacy) is medication to be sent to? [id number] - [place], [place] - [address] AT [facility] OF [address]  3. Do they need a 30 day or 90 day supply?  90 day supply

## 2023-07-25 ENCOUNTER — Telehealth: Payer: Self-pay | Admitting: Cardiovascular Disease

## 2023-07-25 NOTE — Telephone Encounter (Signed)
Hi, I called and spoke to the patient to inquire about his request for a refill of lisinopril. I informed the patient that his medication list was showing the lisinopril-hydrochlorothiazide 20-12.5 mg. The patient states that he has been taking the lisinopril 40 mg tablet and would like a refill. Patient states that he is going out of town and will not have enough while he is away. Please advise.

## 2023-07-25 NOTE — Telephone Encounter (Signed)
Pt c/o medication issue:  1. Name of Medication: lisinopril-hydrochlorothiazide (ZESTORETIC) 20-12.5 MG tablet   2. How are you currently taking this medication (dosage and times per day)? Take 1 tablet by mouth daily.   3. Are you having a reaction (difficulty breathing--STAT)? No  4. What is your medication issue? Patient is calling because he was incorrect on the medication. Patient states it should be the lisinopril-hydrochlorothiazide (ZESTORETIC) 20-12.5 MG tablet . Please advise.

## 2023-07-25 NOTE — Telephone Encounter (Signed)
Called patient and informed him that the pharmacy has the prescription for him. Patient verbalized understanding.

## 2023-07-25 NOTE — Telephone Encounter (Signed)
*  STAT* If patient is at the pharmacy, call can be transferred to refill team.   1. Which medications need to be refilled? (please list name of each medication and dose if known)   lisinopril-hydrochlorothiazide (ZESTORETIC) 20-12.5 MG tablet   2. Would you like to learn more about the convenience, safety, & potential cost savings by using the [facility]?  3. Are you open to using the [facility] (Type [facility]. ).  4. Which pharmacy/location (including street and city if local pharmacy) is medication to be sent to?  [id number] - [place], [place] - [address] AT [facility] OF [address]   5. Do they need a 30 day or 90 day supply?   90 day  Patient stated he wants to get refill for the "plain Lisonopri" medication not the Lisinopril-hydrochlorothiazide medication. Patient noted he will be going out of town today.

## 2023-07-25 NOTE — Telephone Encounter (Signed)
Patient called back and said he was incorrect on the medication. Patient states it should be the lisinopril-hydrochlorothiazide (ZESTORETIC) 20-12.5 MG tablet .  Pharmacy said that the patient has refills available for pick up.

## 2023-08-16 ENCOUNTER — Ambulatory Visit: Payer: BC Managed Care – PPO | Admitting: Urology

## 2023-09-13 ENCOUNTER — Ambulatory Visit: Payer: BC Managed Care – PPO | Admitting: Urology

## 2023-09-27 ENCOUNTER — Ambulatory Visit: Payer: BC Managed Care – PPO | Admitting: Urology

## 2023-09-27 ENCOUNTER — Other Ambulatory Visit
Admission: RE | Admit: 2023-09-27 | Discharge: 2023-09-27 | Disposition: A | Discharge status: Home / Self Care | Payer: BC Managed Care – PPO | Attending: Urology | Admitting: Urology

## 2023-09-27 VITALS — BP 127/74 | HR 61 | Ht 68.5 in | Wt 229.5 lb

## 2023-09-27 DIAGNOSIS — C61 Malignant neoplasm of prostate: Secondary | ICD-10-CM | POA: Diagnosis present

## 2023-09-27 DIAGNOSIS — N401 Enlarged prostate with lower urinary tract symptoms: Secondary | ICD-10-CM | POA: Diagnosis not present

## 2023-09-27 DIAGNOSIS — N400 Enlarged prostate without lower urinary tract symptoms: Secondary | ICD-10-CM | POA: Insufficient documentation

## 2023-09-27 DIAGNOSIS — R3912 Poor urinary stream: Secondary | ICD-10-CM

## 2023-09-27 DIAGNOSIS — R972 Elevated prostate specific antigen [PSA]: Secondary | ICD-10-CM | POA: Diagnosis not present

## 2023-09-27 LAB — PSA: Prostatic Specific Antigen: 6.32 ng/mL — ABNORMAL HIGH (ref 0.00–4.00)

## 2023-09-27 NOTE — Progress Notes (Signed)
Marcelle Overlie Plume,acting as a scribe for Vanna Scotland, MD.,have documented all relevant documentation on the behalf of Vanna Scotland, MD,as directed by  Vanna Scotland, MD while in the presence of Vanna Scotland, MD.  09/27/2023 2:12 PM   Joslyn Hy April 29, 1951 [id number]  Referring provider: Lauro Regulus, MD [address] [facility] [place] [facility] [place],  [place] [address]  Chief Complaint  Patient presents with   Benign Prostatic Hypertrophy    HPI: 72 year-old male who returns today for follow up for BPH with a weak urinary stream/low risk prostate cancer on active surveillance.   He is currently managed on Flomax. More recently, he underwent cystoscopy that showed some subtle intravascular protrusion but no median lobe. His bladder was otherwise healthy.   We have been also following him for elevated/rising PSA. He had a prostate MRI that showed a 44 gram prostate and a PI-RADS 3 lesion.   His most recent PSA on 04/01/2023 was 5.6. He was supposed to have a repeat PSA prior to today's visit but this was not completed.     Prostate biopsy on 04/03/2022 revealed TRUS 63.7. Surgical pathology shows Gleason 3+3 involving 3/12 affecting up to 25%.   Today, he reports improvement on Flomax for 2-3 days but notes visual disturbances if taken more than once. He has since stopped taking Flomax.   PMH: Past Medical History:  Diagnosis Date   CAD (coronary artery disease)    a. 12/2010 s/p 2 vessel CABG x 2 @ [facility] (LIMA-LAD, SVG-PDA); b. cath 03/2011: LM 20%, mLAD 75%, pD1 100% (s/p PCI/DES), mildly diseaed LCx, mRCA 80%, dRCA 95%, patent grafts; c. 12/2015 Ex MV: ex time 7:15, max HR 153.  EF 69%, 1mm lat ST  dep, no ischemia.   Depression    Diastolic dysfunction    a. 05/2022 Echo: EF 60-65%, no rwma, GrII DD, nl RV fxn, RVSP 33.22mmHg, mild AS.   Gout    Hyperlipidemia    Hypertension    Lyme disease    Prostate cancer (HCC)    Statin intolerance    TIA  (transient ischemic attack) 07/2013    Surgical History: Past Surgical History:  Procedure Laterality Date   CARDIAC CATHETERIZATION  12/25/2010   CORONARY ANGIOPLASTY     stent x 1    CORONARY ARTERY BYPASS GRAFT  11/2010   [facility]   right index finger surgery      Home Medications:  Allergies as of 09/27/2023       Reactions   Definity [perflutren Lipid Microsphere] Other (See Comments)   Patient experiences severe low back pain for 30-45 seconds after injection   Tamsulosin Shortness Of Breath   Carvedilol Itching, Swelling   Bupropion Other (See Comments)   Other reaction(s): RASH Upper extremity edema Other reaction(s): RASH Upper extremity edema Other reaction(s): RASH   Omeprazole Other (See Comments)   Other reaction(s): Other (See Comments) Caused muscle cramps Other reaction(s): Other (See Comments) Caused muscle cramps   Prednisone Other (See Comments)   Elevated blood sugar Other reaction(s): SHORTNESS OF BREATH Elevated blood sugars Elevated blood sugar Other reaction(s): SHORTNESS OF BREATH   Statins    Other reaction(s): Muscle Pain   Trazodone Other (See Comments)   Shortness of breath  wheezing   Trazodone And Nefazodone    Shortness of breath  wheezing        Medication List        Accurate as of September 27, 2023  2:12 PM. If you have any questions, ask your nurse or doctor.          STOP taking these medications    amLODipine 5 MG tablet Commonly known as: NORVASC       TAKE these medications    allopurinol 150 mg Tabs tablet Commonly known as: ZYLOPRIM Take 150 mg by mouth daily.   aspirin EC 81 MG tablet Take 81 mg by mouth daily.   Fish Oil 1000 MG Caps Take by mouth 2 (two) times daily.   lisinopril-hydrochlorothiazide 20-12.5 MG tablet Commonly known as: ZESTORETIC Take 1 tablet by mouth daily.   metoprolol tartrate 50 MG tablet Commonly known as: LOPRESSOR Take 1 tablet (50 mg total) by mouth 2 (two) times  daily.   nitroGLYCERIN 0.4 MG SL tablet Commonly known as: NITROSTAT Place 1 tablet (0.4 mg total) under the tongue every 5 (five) minutes as needed for chest pain.   VITAMIN B12 PO Take 1,000 mcg by mouth daily.        Allergies:  Allergies  Allergen Reactions   Definity [Perflutren Lipid Microsphere] Other (See Comments)    Patient experiences severe low back pain for 30-45 seconds after injection   Tamsulosin Shortness Of Breath   Carvedilol Itching and Swelling   Bupropion Other (See Comments)    Other reaction(s): RASH Upper extremity edema Other reaction(s): RASH Upper extremity edema Other reaction(s): RASH   Omeprazole Other (See Comments)    Other reaction(s): Other (See Comments) Caused muscle cramps Other reaction(s): Other (See Comments) Caused muscle cramps   Prednisone Other (See Comments)    Elevated blood sugar  Other reaction(s): SHORTNESS OF BREATH Elevated blood sugars Elevated blood sugar Other reaction(s): SHORTNESS OF BREATH   Statins     Other reaction(s): Muscle Pain   Trazodone Other (See Comments)    Shortness of breath  wheezing   Trazodone And Nefazodone     Shortness of breath  wheezing    Family History: Family History  Problem Relation Age of Onset   Heart attack Father 3    Social History:  reports that he quit smoking about 45 years ago. His smoking use included cigarettes. He has been exposed to tobacco smoke. He has never used smokeless tobacco. He reports current alcohol use of about 2.0 standard drinks of alcohol per week. He reports that he does not use drugs.   Physical Exam: BP 127/74   Pulse 61   Ht 5' 8.5" (1.74 m)   Wt 229 lb 8 oz (104.1 kg)   BMI 34.39 kg/m   Constitutional:  Alert and oriented, No acute distress. HEENT: [place] AT, moist mucus membranes.  Trachea midline, no masses. Neurologic: Grossly intact, no focal deficits, moving all 4 extremities. Psychiatric: Normal mood and affect.   Assessment &  Plan:    1. BPH with weak urinary stream - Discussed the option of UroLift procedure, which is a minimally invasive treatment for BPH. He is a good candidate, but he is currently undecided. - Plan to reassess after obtaining the PSA results. - PSA today  2. Elevated PSA/ low risk prostate cancer - PSA today - If PSA is stable or decreasing, consider monitoring or proceeding with the UroLift procedure. - If PSA is increasing, plan to perform a fusion biopsy targeting the PI-RADS 3 lesion identified on MRI.  Return for discussion of PSA results and further treatment.  I have reviewed the above documentation for accuracy and completeness, and I agree with the above.  Vanna Scotland, MD    [facility] [address], [address] [place], [place] [address] [phone]

## 2023-10-01 ENCOUNTER — Telehealth: Payer: Self-pay | Admitting: Cardiovascular Disease

## 2023-10-01 NOTE — Telephone Encounter (Signed)
   Name: Todd Collins  DOB: [birth year]  MRN: [id number]  Primary Cardiologist: Julien Nordmann, MD  Chart reviewed as part of pre-operative protocol coverage. Because of Sho A Wandersee's past medical history and time since last visit, he will require a follow-up telephone visit in order to better assess preoperative cardiovascular risk.  Pre-op covering staff: - Please schedule appointment and call patient to inform them. If patient already had an upcoming appointment within acceptable timeframe, please add "pre-op clearance" to the appointment notes so provider is aware. - Please contact requesting surgeon's office via preferred method (i.e, phone, fax) to inform them of need for appointment prior to surgery.  As long as patient is asymptomatic at the time of phone call, can hold aspirin for 5 to 7 days prior to procedure.  Please restart when medically safe to do so.   Sharlene Dory, PA-C  10/01/2023, 9:16 AM

## 2023-10-01 NOTE — Telephone Encounter (Signed)
   Pre-operative Risk Assessment    Patient Name: RANNON ECONOMOS  DOB: [birth year] MRN: [id number]      Request for Surgical Clearance   Procedure:  Fusion Prostate Biopsy  Date of Surgery:  Clearance 11/06/23                                 Surgeon:  Dr. Allayne Stack Group or Practice Name:  [facility] Phone number:  [phone] Fax number:  [phone]   Type of Clearance Requested:  Hold Aspirin 5-7 days prior    Type of Anesthesia:  Not Indicated   Additional requests/questions:    Signed, Narda Amber   10/01/2023, 9:05 AM

## 2023-10-01 NOTE — Telephone Encounter (Signed)
I tried to call the pt and his phone hung as soon as it was picked up. This is the 2nd attempt today to try and reach the pt. DPR ok to s/w daughter Roe Coombs. I left message for pt's daughter that we will need someone to call back as we need to schedule a tele pre op appt.

## 2023-10-01 NOTE — Telephone Encounter (Signed)
Called pt to set up tele.  Phone hung up and said pt was not available.  Will try pt later.

## 2023-10-02 ENCOUNTER — Telehealth: Payer: Self-pay | Admitting: *Deleted

## 2023-10-02 NOTE — Telephone Encounter (Signed)
I s/w the pt's daughter Darl Pikes [facility]) who has scheduled a tele preop appt for her dad (the pt) 10/22/23. Med rec and consent are done.

## 2023-10-02 NOTE — Telephone Encounter (Signed)
Pt daughter called in she states she also sees pt's during the day and would not be able to answer until after 4 pm. She said you can attempt to call the pt instead, she said he didn't answer because the call did not pop up as [place] but she informed him to look out for a call.

## 2023-10-02 NOTE — Telephone Encounter (Signed)
I s/w the pt's daughter Todd Collins [facility]) who has scheduled a tele preop appt for her dad (the pt) 10/22/23. Med rec and consent are done.      Patient Consent for Virtual Visit        Todd Collins has provided verbal consent on 10/02/2023 for a virtual visit (video or telephone).   CONSENT FOR VIRTUAL VISIT FOR:  Todd Collins  By participating in this virtual visit I agree to the following:  I hereby voluntarily request, consent and authorize [facility] and its employed or contracted physicians, physician assistants, nurse practitioners or other licensed health care professionals (the Practitioner), to provide me with telemedicine health care services (the "Services") as deemed necessary by the treating Practitioner. I acknowledge and consent to receive the Services by the Practitioner via telemedicine. I understand that the telemedicine visit will involve communicating with the Practitioner through live audiovisual communication technology and the disclosure of certain medical information by electronic transmission. I acknowledge that I have been given the opportunity to request an in-person assessment or other available alternative prior to the telemedicine visit and am voluntarily participating in the telemedicine visit.  I understand that I have the right to withhold or withdraw my consent to the use of telemedicine in the course of my care at any time, without affecting my right to future care or treatment, and that the Practitioner or I may terminate the telemedicine visit at any time. I understand that I have the right to inspect all information obtained and/or recorded in the course of the telemedicine visit and may receive copies of available information for a reasonable fee.  I understand that some of the potential risks of receiving the Services via telemedicine include:  Delay or interruption in medical evaluation due to technological equipment failure or  disruption; Information transmitted may not be sufficient (e.g. poor resolution of images) to allow for appropriate medical decision making by the Practitioner; and/or  In rare instances, security protocols could fail, causing a breach of personal health information.  Furthermore, I acknowledge that it is my responsibility to provide information about my medical history, conditions and care that is complete and accurate to the best of my ability. I acknowledge that Practitioner's advice, recommendations, and/or decision may be based on factors not within their control, such as incomplete or inaccurate data provided by me or distortions of diagnostic images or specimens that may result from electronic transmissions. I understand that the practice of medicine is not an exact science and that Practitioner makes no warranties or guarantees regarding treatment outcomes. I acknowledge that a copy of this consent can be made available to me via my patient portal [facility] MyChart), or I can request a printed copy by calling the office of [facility].    I understand that my insurance will be billed for this visit.   I have read or had this consent read to me. I understand the contents of this consent, which adequately explains the benefits and risks of the Services being provided via telemedicine.  I have been provided ample opportunity to ask questions regarding this consent and the Services and have had my questions answered to my satisfaction. I give my informed consent for the services to be provided through the use of telemedicine in my medical care

## 2023-10-22 ENCOUNTER — Telehealth: Payer: Self-pay

## 2023-10-22 ENCOUNTER — Ambulatory Visit: Payer: BC Managed Care – PPO | Attending: Physician Assistant

## 2023-10-22 DIAGNOSIS — Z01810 Encounter for preprocedural cardiovascular examination: Secondary | ICD-10-CM

## 2023-10-22 NOTE — Progress Notes (Signed)
 Virtual Visit via Telephone Note   Because of Todd Collins's co-morbid illnesses, he is at least at moderate risk for complications without adequate follow up.  This format is felt to be most appropriate for this patient at this time.  The patient did not have access to video technology/had technical difficulties with video requiring transitioning to audio format only (telephone).  All issues noted in this document were discussed and addressed.  No physical exam could be performed with this format.  Please refer to the patient's chart for his consent to telehealth for Todd Collins.  Evaluation Performed:  Preoperative cardiovascular risk assessment _____________   Date:  10/22/2023   Patient ID:  Todd Collins, DOB November 20, 1950, MRN [id number] Patient Location:  Home Provider location:   Office  Primary Care Provider:  Lenon Layman ORN, MD Primary Cardiologist:  Evalene Lunger, MD  Chief Complaint / Patient Profile   72 y.o. y/o male with a h/o coronary artery disease with CABG x 2 (LIMA to LAD and vein graft to PDA 02/02/2011), TIA, PCI of native D1 2012 several months later, depression, hyperlipidemia, and hypertension who is pending fusion prostate biopsy and presents today for telephonic preoperative cardiovascular risk assessment.  History of Present Illness    Todd Collins is a 72 y.o. male who presents via audio/video conferencing for a telehealth visit today.  Pt was last seen in cardiology clinic on 02/04/2023 by Dr. Gollan.  At that time [name] was doing well .  The patient is now pending procedure as outlined above. Since his last visit, he he tells me that he has had no symptoms.  No palpitations, shortness of breath, or chest pain.  Occasionally with more intense activity he does have some shortness of breath but this is likely due to recent inactivity.  No issues with walking but longer distances he does get more tired.  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  For this reason he has surpassed the 4 METS on the DASI.   The patient can hold aspirin  for 5 to 7 days prior to procedure. Please restart when medically safe to do so.   Past Medical History    Past Medical History:  Diagnosis Date   CAD (coronary artery disease)    a. 12/2010 s/p 2 vessel CABG x 2 @ [facility] (LIMA-LAD, SVG-PDA); b. cath 03/2011: LM 20%, mLAD 75%, pD1 100% (s/p PCI/DES), mildly diseaed LCx, mRCA 80%, dRCA 95%, patent grafts; c. 12/2015 Ex MV: ex time 7:15, max HR 153.  EF 69%, 1mm lat ST  dep, no ischemia.   Depression    Diastolic dysfunction    a. 05/2022 Echo: EF 60-65%, no rwma, GrII DD, nl RV fxn, RVSP 33.69mmHg, mild AS.   Gout    Hyperlipidemia    Hypertension    Lyme disease    Prostate cancer (HCC)    Statin intolerance    TIA (transient ischemic attack) 07/2013   Past Surgical History:  Procedure Laterality Date   CARDIAC CATHETERIZATION  12/25/2010   CORONARY ANGIOPLASTY     stent x 1    CORONARY ARTERY BYPASS GRAFT  11/2010   [facility]   right index finger surgery      Allergies  Allergies  Allergen Reactions   Definity  [Perflutren  Lipid Microsphere] Other (See Comments)    Patient experiences severe low back pain for 30-45 seconds after injection  Tamsulosin  Shortness Of Breath   Carvedilol  Itching and Swelling   Bupropion Other (See Comments)    Other reaction(s): RASH Upper extremity edema Other reaction(s): RASH Upper extremity edema Other reaction(s): RASH   Omeprazole Other (See Comments)    Other reaction(s): Other (See Comments) Caused muscle cramps Other reaction(s): Other (See Comments) Caused muscle cramps   Prednisone Other (See Comments)    Elevated blood sugar  Other reaction(s): SHORTNESS OF BREATH Elevated blood sugars Elevated blood sugar Other reaction(s): SHORTNESS OF BREATH   Statins     Other reaction(s): Muscle Pain   Trazodone Other (See Comments)     Shortness of breath  wheezing   Trazodone And Nefazodone     Shortness of breath  wheezing    Home Medications    Prior to Admission medications   Medication Sig Start Date End Date Taking? Authorizing Provider  allopurinol (ZYLOPRIM) 150 mg TABS tablet Take 150 mg by mouth daily.    [provider]  aspirin  EC 81 MG tablet Take 81 mg by mouth daily.     [provider]  Cyanocobalamin (VITAMIN B12 PO) Take 1,000 mcg by mouth daily.    [provider]  lisinopril -hydrochlorothiazide  (ZESTORETIC ) 20-12.5 MG tablet Take 1 tablet by mouth daily. 02/04/23 02/04/24  Gollan, Timothy J, MD  metoprolol  tartrate (LOPRESSOR ) 50 MG tablet Take 1 tablet (50 mg total) by mouth 2 (two) times daily. 02/04/23 01/30/24  Gollan, Timothy J, MD  nitroGLYCERIN  (NITROSTAT ) 0.4 MG SL tablet Place 1 tablet (0.4 mg total) under the tongue every 5 (five) minutes as needed for chest pain. 04/17/22   Furth, Cadence H, PA-C  Omega-3 Fatty Acids (FISH OIL) 1000 MG CAPS Take by mouth 2 (two) times daily.    [provider]    Physical Exam    Vital Signs:  ORLONDO HOLYCROSS does not have vital signs available for review today.  Given telephonic nature of communication, physical exam is limited. AAOx3. NAD. Normal affect.  Speech and respirations are unlabored.  Accessory Clinical Findings    None  Assessment & Plan    1.  Preoperative Cardiovascular Risk Assessment:  Mr. Keeter perioperative risk of a major cardiac event is 6.6% according to the Revised Cardiac Risk Index (RCRI).  Therefore, he is at high risk for perioperative complications.   His functional capacity is good at 5.62 METs according to the [place] Activity Status Index (DASI). Recommendations: According to ACC/AHA guidelines, no further cardiovascular testing needed.  The patient may proceed to surgery at acceptable risk.   Antiplatelet and/or Anticoagulation Recommendations: Aspirin  can be held for 5-7 days prior  to his surgery.  Please resume Aspirin  post operatively when it is felt to be safe from a bleeding standpoint.   A copy of this note will be routed to requesting surgeon.  Time:   Today, I have spent 7 minutes with the patient with telehealth technology discussing medical history, symptoms, and management plan.     Orren LOISE Fabry, PA-C  10/22/2023, 8:11 AM

## 2023-10-22 NOTE — Telephone Encounter (Signed)
Received cardiac clearance stating patient can stop taking Aspirin 5 to 7 days prior to his prostate biopsy appointment on 11/06/23

## 2023-11-06 ENCOUNTER — Ambulatory Visit: Payer: 59 | Admitting: Urology

## 2023-11-06 ENCOUNTER — Encounter: Payer: Self-pay | Admitting: Urology

## 2023-11-06 VITALS — BP 143/77 | HR 82

## 2023-11-06 DIAGNOSIS — Z2989 Encounter for other specified prophylactic measures: Secondary | ICD-10-CM | POA: Diagnosis not present

## 2023-11-06 DIAGNOSIS — R972 Elevated prostate specific antigen [PSA]: Secondary | ICD-10-CM

## 2023-11-06 DIAGNOSIS — C61 Malignant neoplasm of prostate: Secondary | ICD-10-CM | POA: Diagnosis not present

## 2023-11-06 MED ORDER — GENTAMICIN SULFATE 40 MG/ML IJ SOLN
80.0000 mg | Freq: Once | INTRAMUSCULAR | Status: AC
  Administered 2023-11-06: 80 mg via INTRAMUSCULAR

## 2023-11-06 MED ORDER — LEVOFLOXACIN 500 MG PO TABS
500.0000 mg | ORAL_TABLET | Freq: Once | ORAL | Status: AC
  Administered 2023-11-06: 500 mg via ORAL

## 2023-11-06 NOTE — Progress Notes (Signed)
   11/06/23  Indication: Low risk prostate cancer on active surveillance  MRI Fusion Prostate Biopsy Procedure   Informed consent was obtained, and we discussed the risks of bleeding and infection/sepsis. A time out was performed to ensure correct patient identity.  Pre-Procedure: - Last PSA Level: 6.32 - Gentamicin  and levaquin  given for antibiotic prophylaxis -Prostate measured 44 g on MRI, PSA density 0.14 - Small median lobe  Procedure: - Prostate block performed using 10 cc 1% lidocaine   - MRI fusion biopsy was performed, and 3 biopsies were taken from the ROI#1 PIRADS 3 lesion located right posterior lateral peripheral zone mid gland - Standard biopsies taken from sextant areas, 12 under ultrasound guidance. - Total of 15 cores taken  Post-Procedure: - Patient tolerated the procedure well - He was counseled to seek immediate medical attention if experiences significant bleeding, fevers, or severe pain - Return in one week to discuss biopsy results  Assessment/ Plan: Will follow up in 1-2 weeks to discuss pathology with Dr. Jhonny Moss, MD 11/06/2023

## 2023-11-06 NOTE — Patient Instructions (Signed)

## 2023-11-12 ENCOUNTER — Ambulatory Visit: Payer: BC Managed Care – PPO | Admitting: Urology

## 2023-11-19 ENCOUNTER — Ambulatory Visit: Payer: 59 | Admitting: Urology

## 2023-11-19 VITALS — BP 148/80 | HR 66 | Ht 68.0 in | Wt 230.0 lb

## 2023-11-19 DIAGNOSIS — C61 Malignant neoplasm of prostate: Secondary | ICD-10-CM

## 2023-11-19 MED ORDER — ORGOVYX 120 MG PO TABS: ORAL_TABLET | ORAL | 5 refills | Status: DC

## 2023-11-21 NOTE — Progress Notes (Signed)
I,Amy L Pierron,acting as a scribe for Vanna Scotland, MD.,have documented all relevant documentation on the behalf of Vanna Scotland, MD,as directed by  Vanna Scotland, MD while in the presence of Vanna Scotland, MD.  11/19/2023 9:36 AM   Todd Collins 12/19/1950 [id number]  Referring provider: Lauro Regulus, MD [address] [facility] [place] [address] [place],  [place] [address]  Chief Complaint  Patient presents with   Results    HPI: 73 year-old male with a personal history of low risk prostate cancer on active surveillance presents today for follow-up of fusion biopsy.  Please see previous notes for details. He was first diagnosed with low-risk Gleason 3+3 prostate cancer involving 3 cores in June 2024. His PSA continued to rise to 6.32, and as such, he underwent a fusion biopsy based on a PI-RADS 3 lesion of the right posterior lateral peripheral zone from an MRI in July. This did show upstaging of his prostate cancer. Now, Gleason 3+4, involving 2 cores, one of which was the region of interest, the Pi-RADS 3 lesion.   He does have multiple medical comorbidities. His BMI is 35.  He is accompanied today with a relative.   He hasn't seen Dr. Rushie Chestnut for a consultation yet. He is interested in managing this so it doesn't spread, and open to treatment.    IPSS     Row Name 11/19/23 1600         International Prostate Symptom Score   How often have you had the sensation of not emptying your bladder? Not at All     How often have you had to urinate less than every two hours? Less than half the time     How often have you found you stopped and started again several times when you urinated? Less than 1 in 5 times     How often have you found it difficult to postpone urination? About half the time     How often have you had a weak urinary stream? Almost always     How often have you had to strain to start urination? Not at All     How many times did you typically  get up at night to urinate? 3 Times     Total IPSS Score 14       Quality of Life due to urinary symptoms   If you were to spend the rest of your life with your urinary condition just the way it is now how would you feel about that? Mostly Satisfied            Score:  1-7 Mild 8-19 Moderate 20-35 Severe  SHIM     Row Name 11/19/23 1638         SHIM: Over the last 6 months:   How do you rate your confidence that you could get and keep an erection? Moderate     When you had erections with sexual stimulation, how often were your erections hard enough for penetration (entering your partner)? Most Times (much more than half the time)     During sexual intercourse, how often were you able to maintain your erection after you had penetrated (entered) your partner? A Few Times (much less than half the time)     During sexual intercourse, how difficult was it to maintain your erection to completion of intercourse? Slightly Difficult     When you attempted sexual intercourse, how often was it satisfactory for you? Almost Always or  Always       SHIM Total Score   SHIM 18               PMH: Past Medical History:  Diagnosis Date   CAD (coronary artery disease)    a. 12/2010 s/p 2 vessel CABG x 2 @ [facility] (LIMA-LAD, SVG-PDA); b. cath 03/2011: LM 20%, mLAD 75%, pD1 100% (s/p PCI/DES), mildly diseaed LCx, mRCA 80%, dRCA 95%, patent grafts; c. 12/2015 Ex MV: ex time 7:15, max HR 153.  EF 69%, 1mm lat ST  dep, no ischemia.   Depression    Diastolic dysfunction    a. 05/2022 Echo: EF 60-65%, no rwma, GrII DD, nl RV fxn, RVSP 33.68mmHg, mild AS.   Gout    Hyperlipidemia    Hypertension    Lyme disease    Prostate cancer (HCC)    Statin intolerance    TIA (transient ischemic attack) 07/2013    Surgical History: Past Surgical History:  Procedure Laterality Date   CARDIAC CATHETERIZATION  12/25/2010   CORONARY ANGIOPLASTY     stent x 1    CORONARY ARTERY BYPASS GRAFT  11/2010   [facility]    right index finger surgery      Home Medications:  Allergies as of 11/19/2023       Reactions   Definity [perflutren Lipid Microsphere] Other (See Comments)   Patient experiences severe low back pain for 30-45 seconds after injection   Tamsulosin Shortness Of Breath   Carvedilol Itching, Swelling   Bupropion Other (See Comments)   Other reaction(s): RASH Upper extremity edema Other reaction(s): RASH Upper extremity edema Other reaction(s): RASH   Omeprazole Other (See Comments)   Other reaction(s): Other (See Comments) Caused muscle cramps Other reaction(s): Other (See Comments) Caused muscle cramps   Prednisone Other (See Comments)   Elevated blood sugar Other reaction(s): SHORTNESS OF BREATH Elevated blood sugars Elevated blood sugar Other reaction(s): SHORTNESS OF BREATH   Statins    Other reaction(s): Muscle Pain   Trazodone Other (See Comments)   Shortness of breath  wheezing   Trazodone And Nefazodone    Shortness of breath  wheezing        Medication List        Accurate as of November 19, 2023 11:59 PM. If you have any questions, ask your nurse or doctor.          allopurinol 150 mg Tabs tablet Commonly known as: ZYLOPRIM Take 150 mg by mouth daily.   aspirin EC 81 MG tablet Take 81 mg by mouth daily.   Fish Oil 1000 MG Caps Take by mouth 2 (two) times daily.   lisinopril-hydrochlorothiazide 20-12.5 MG tablet Commonly known as: ZESTORETIC Take 1 tablet by mouth daily.   metoprolol tartrate 50 MG tablet Commonly known as: LOPRESSOR Take 1 tablet (50 mg total) by mouth 2 (two) times daily.   nitroGLYCERIN 0.4 MG SL tablet Commonly known as: NITROSTAT Place 1 tablet (0.4 mg total) under the tongue every 5 (five) minutes as needed for chest pain.   Orgovyx 120 MG tablet Generic drug: relugolix Take 3 tablets at once and then take 1 tablet once daily thereafter   VITAMIN B12 PO Take 1,000 mcg by mouth daily.        Allergies:   Allergies  Allergen Reactions   Definity [Perflutren Lipid Microsphere] Other (See Comments)    Patient experiences severe low back pain for 30-45 seconds after injection   Tamsulosin Shortness Of Breath   Carvedilol  Itching and Swelling   Bupropion Other (See Comments)    Other reaction(s): RASH Upper extremity edema Other reaction(s): RASH Upper extremity edema Other reaction(s): RASH   Omeprazole Other (See Comments)    Other reaction(s): Other (See Comments) Caused muscle cramps Other reaction(s): Other (See Comments) Caused muscle cramps   Prednisone Other (See Comments)    Elevated blood sugar  Other reaction(s): SHORTNESS OF BREATH Elevated blood sugars Elevated blood sugar Other reaction(s): SHORTNESS OF BREATH   Statins     Other reaction(s): Muscle Pain   Trazodone Other (See Comments)    Shortness of breath  wheezing   Trazodone And Nefazodone     Shortness of breath  wheezing    Family History: Family History  Problem Relation Age of Onset   Heart attack Father 13    Social History:  reports that he quit smoking about 45 years ago. His smoking use included cigarettes. He has been exposed to tobacco smoke. He has never used smokeless tobacco. He reports current alcohol use of about 2.0 standard drinks of alcohol per week. He reports that he does not use drugs.   Physical Exam: BP (!) 148/80   Pulse 66   Ht 5\' 8"  (1.727 m)   Wt 230 lb (104.3 kg)   BMI 34.97 kg/m   Constitutional:  Alert and oriented, No acute distress. HEENT: [place] AT, moist mucus membranes.  Trachea midline, no masses. Neurologic: Grossly intact, no focal deficits, moving all 4 extremities. Psychiatric: Normal mood and affect.   Assessment & Plan:    1. Prostate cancer  - Upstaging to intermediate risk, albeit relatively low volume of disease. Based on this new category of disease, he could consider treatment for his prostate cancer. Given his comorbidities, habitus, and age would  more strongly recommend radiation with six months of hormones as an approach. Alternatively, we could consider active surveillance given the relatively low volume, as he does meet the criteria for this, which would likely necessitate another biopsy down the road, serial MRIs, et Karie Soda.   - Mentioned about the option of genetic testing of the cancer to see more details regarding the type of tumor.   - Referred to Dr. Rushie Chestnut for radiation consultation to determine which type is recommended for him specifically. Will confer with him afterwards and proceed with treatment.  - He is on the edge of where the guidelines indicate needing hormone treatment would be required. Reviewed that hormones is a one time injection. Side effects of lowering testosterone to undetectable levels resulting in hot flashes, low libido, weight gain, loss of muscle mass. The hormones enhance the effect of radiation and decrease the chance of the caner recurring. There is a one time injection or a daily pill Orgovyx. If he can't tolerate the side effects then he can stop taking the pill, whereas with the injection it is one and done. He is open to either option and will see which one is more affordable with his insurance.   I have reviewed the above documentation for accuracy and completeness, and I agree with the above.   Vanna Scotland, MD   [facility] [address], [address] [place], [place] [address] [phone]  I spent 34 total minutes on the day of the encounter including pre-visit review of the medical record, face-to-face time with the patient, and post visit ordering of labs/imaging/tests.

## 2023-11-26 ENCOUNTER — Telehealth: Payer: Self-pay

## 2023-11-26 ENCOUNTER — Institutional Professional Consult (permissible substitution): Payer: Self-pay | Admitting: Radiation Oncology

## 2023-11-26 NOTE — Telephone Encounter (Signed)
Called [employer] at [phone] and spoke with Rinaldo Cloud and started PA for Orgovyx. Pending auth # [id number]. [employer] advised of this via email.

## 2023-11-27 NOTE — Telephone Encounter (Signed)
 PA approved and [employer] notified via email. Dates 11/26/23-11/25/24

## 2023-11-28 ENCOUNTER — Ambulatory Visit
Admission: RE | Admit: 2023-11-28 | Discharge: 2023-11-28 | Disposition: A | Discharge status: Home / Self Care | Payer: 59 | Source: Ambulatory Visit | Attending: Radiation Oncology | Admitting: Radiation Oncology

## 2023-11-28 ENCOUNTER — Encounter: Payer: Self-pay | Admitting: Radiation Oncology

## 2023-11-28 VITALS — BP 136/83 | HR 64 | Temp 98.0°F | Resp 16 | Wt 225.0 lb

## 2023-11-28 DIAGNOSIS — M109 Gout, unspecified: Secondary | ICD-10-CM | POA: Diagnosis not present

## 2023-11-28 DIAGNOSIS — C61 Malignant neoplasm of prostate: Secondary | ICD-10-CM | POA: Insufficient documentation

## 2023-11-28 DIAGNOSIS — A6920 Lyme disease, unspecified: Secondary | ICD-10-CM | POA: Insufficient documentation

## 2023-11-28 DIAGNOSIS — Z8042 Family history of malignant neoplasm of prostate: Secondary | ICD-10-CM | POA: Insufficient documentation

## 2023-11-28 DIAGNOSIS — Z7982 Long term (current) use of aspirin: Secondary | ICD-10-CM | POA: Diagnosis not present

## 2023-11-28 DIAGNOSIS — Z8673 Personal history of transient ischemic attack (TIA), and cerebral infarction without residual deficits: Secondary | ICD-10-CM | POA: Diagnosis not present

## 2023-11-28 DIAGNOSIS — E785 Hyperlipidemia, unspecified: Secondary | ICD-10-CM | POA: Diagnosis not present

## 2023-11-28 DIAGNOSIS — I10 Essential (primary) hypertension: Secondary | ICD-10-CM | POA: Insufficient documentation

## 2023-11-28 DIAGNOSIS — Z87891 Personal history of nicotine dependence: Secondary | ICD-10-CM | POA: Insufficient documentation

## 2023-11-28 DIAGNOSIS — Z923 Personal history of irradiation: Secondary | ICD-10-CM | POA: Insufficient documentation

## 2023-11-28 DIAGNOSIS — I2510 Atherosclerotic heart disease of native coronary artery without angina pectoris: Secondary | ICD-10-CM | POA: Diagnosis not present

## 2023-11-28 DIAGNOSIS — Z79899 Other long term (current) drug therapy: Secondary | ICD-10-CM | POA: Insufficient documentation

## 2023-11-28 NOTE — Telephone Encounter (Signed)
 Received feedback/update from Adams from [employer] via email on the patient. Information posted below.   Drug:  Orgovyx  Copay:  $10   Prior Auth Date: (if required)  11/26/23-12/24/23           Insurance:  [employer] COMM [facility])           Before funding:  $1537.60           After funding:  $10   Financial Assistance:             Dollar amount: $10000           Remaining Grant Amount:  $8472.40           Copay card:  Orgovyx  Copay Card   Ship Date:  02/06   Expected Delivery Date:  02/07

## 2023-11-28 NOTE — Consult Note (Signed)
 NEW PATIENT EVALUATION  Name: Todd Collins  MRN: [id number]  Date:   11/28/2023     DOB: 1950/11/11   This 73 y.o. male patient presents to the clinic for initial evaluation of clinical stage IIb (cT1 cN0 M0) Gleason 7 (3+4) adenocarcinoma the prostate presenting with a PSA in the 6 range.  REFERRING PHYSICIAN: Penne Knee, MD  CHIEF COMPLAINT:  Chief Complaint  Patient presents with   Prostate Cancer    DIAGNOSIS: The encounter diagnosis was Malignant neoplasm of prostate (HCC).   PREVIOUS INVESTIGATIONS:  MRI scan reviewed Clinical notes reviewed Pathology report reviewed  HPI: Patient is a pleasant 73 year old male who has been followed for about a year for elevated PSA.  Original biopsy a year ago was for Gleason 6 adenocarcinoma.  PSA at that time was in the 4 range.  Has been elevated recently to the 6 range he underwent MRI scan of his prostate showing a PI-RADS category 3 lesion of the right posterior lateral peripheral zone.  He did have a prominent 1 cm right external iliac lymph node not overtly pathologic.  UroNav targeting showed 4 cores out of 13 positive for adenocarcinoma.  2 of the cores were Gleason 6 (3+3) and 2 cores were Gleason 7 (3+4).  Patient does have a weak stream has tried Flomax  in the past but discontinued secondary to side effects.  He otherwise has very little symptoms.  He is having no bone pain.  He has been seen by urology is now referred to radiation oncology for opinion.  PLANNED TREATMENT REGIMEN: Image guided IMRT radiation therapy  PAST MEDICAL HISTORY:  has a past medical history of CAD (coronary artery disease), Depression, Diastolic dysfunction, Gout, Hyperlipidemia, Hypertension, Lyme disease, Prostate cancer (HCC), Statin intolerance, and TIA (transient ischemic attack) (07/2013).    PAST SURGICAL HISTORY:  Past Surgical History:  Procedure Laterality Date   CARDIAC CATHETERIZATION  12/25/2010   CORONARY ANGIOPLASTY     stent x 1     CORONARY ARTERY BYPASS GRAFT  11/2010   [facility]   right index finger surgery      FAMILY HISTORY: family history includes Heart attack (age of onset: 2) in his father.  SOCIAL HISTORY:  reports that he quit smoking about 46 years ago. His smoking use included cigarettes. He has been exposed to tobacco smoke. He has never used smokeless tobacco. He reports current alcohol use of about 2.0 standard drinks of alcohol per week. He reports that he does not use drugs.  ALLERGIES: Definity  [perflutren  lipid microsphere], Tamsulosin , Carvedilol , Bupropion, Omeprazole, Prednisone, Statins, Trazodone, and Trazodone and nefazodone  MEDICATIONS:  Current Outpatient Medications  Medication Sig Dispense Refill   allopurinol (ZYLOPRIM) 150 mg TABS tablet Take 150 mg by mouth daily.     aspirin  EC 81 MG tablet Take 81 mg by mouth daily.      Cyanocobalamin (VITAMIN B12 PO) Take 1,000 mcg by mouth daily.     lisinopril -hydrochlorothiazide  (ZESTORETIC ) 20-12.5 MG tablet Take 1 tablet by mouth daily. 90 tablet 3   metoprolol  tartrate (LOPRESSOR ) 50 MG tablet Take 1 tablet (50 mg total) by mouth 2 (two) times daily. 180 tablet 3   nitroGLYCERIN  (NITROSTAT ) 0.4 MG SL tablet Place 1 tablet (0.4 mg total) under the tongue every 5 (five) minutes as needed for chest pain. 25 tablet 3   Omega-3 Fatty Acids (FISH OIL) 1000 MG CAPS Take by mouth 2 (two) times daily.     relugolix  (ORGOVYX ) 120 MG tablet Take 3  tablets at once and then take 1 tablet once daily thereafter 33 tablet 5   No current facility-administered medications for this encounter.    ECOG PERFORMANCE STATUS:  0 - Asymptomatic  REVIEW OF SYSTEMS: Patient denies any weight loss, fatigue, weakness, fever, chills or night sweats. Patient denies any loss of vision, blurred vision. Patient denies any ringing  of the ears or hearing loss. No irregular heartbeat. Patient denies heart murmur or history of fainting. Patient denies any chest pain or pain  radiating to her upper extremities. Patient denies any shortness of breath, difficulty breathing at night, cough or hemoptysis. Patient denies any swelling in the lower legs. Patient denies any nausea vomiting, vomiting of blood, or coffee ground material in the vomitus. Patient denies any stomach pain. Patient states has had normal bowel movements no significant constipation or diarrhea. Patient denies any dysuria, hematuria or significant nocturia. Patient denies any problems walking, swelling in the joints or loss of balance. Patient denies any skin changes, loss of hair or loss of weight. Patient denies any excessive worrying or anxiety or significant depression. Patient denies any problems with insomnia. Patient denies excessive thirst, polyuria, polydipsia. Patient denies any swollen glands, patient denies easy bruising or easy bleeding. Patient denies any recent infections, allergies or URI. Patient s visual fields have not changed significantly in recent time.   PHYSICAL EXAM: BP 136/83   Pulse 64   Temp 98 F (36.7 C) (Temporal)   Resp 16   Wt 225 lb (102.1 kg)   BMI 34.21 kg/m  Well-developed well-nourished patient in NAD. HEENT reveals PERLA, EOMI, discs not visualized.  Oral cavity is clear. No oral mucosal lesions are identified. Neck is clear without evidence of cervical or supraclavicular adenopathy. Lungs are clear to A&P. Cardiac examination is essentially unremarkable with regular rate and rhythm without murmur rub or thrill. Abdomen is benign with no organomegaly or masses noted. Motor sensory and DTR levels are equal and symmetric in the upper and lower extremities. Cranial nerves II through XII are grossly intact. Proprioception is intact. No peripheral adenopathy or edema is identified. No motor or sensory levels are noted. Crude visual fields are within normal range.  LABORATORY DATA: Pathology reports reviewed    RADIOLOGY RESULTS: MRI scan reviewed compatible with  above-stated findings   IMPRESSION: Clinical stage IIb Gleason 7 (3+4) adenocarcinoma the prostate in 73 year old male  PLAN: At this time I have run the [facility] nomogram showing only a 4% chance of lymph node involvement.  I believe the external iliac lymph node is most likely nonpathologic based on only slight risk of lymph node involvement.  I have discussed both I-125 interstitial implant as well as image guided IMRT radiation therapy.  Patient has opted for external beam IMRT treatment.  Risks and benefits of treatment including increased lower urinary tract symptoms diarrhea fatigue alteration blood counts skin reaction all were reviewed with the patient in detail.  He is already got a prescription for Orgovyx  which she will take as soon as his prescription is filled.  I have asked Dr. Penne to place fiducial markers in his prostate for daily image guided treatment.  Once that is arranged we will set up simulation.  Patient comprehends my recommendations well.  He has consented to treatment.  I would like to take this opportunity to thank you for allowing me to participate in the care of your patient..  Commend  Marcey Penton, MD

## 2024-01-14 ENCOUNTER — Ambulatory Visit: Payer: 59 | Admitting: Urology

## 2024-01-14 VITALS — BP 139/73 | HR 59 | Ht 68.0 in | Wt 224.0 lb

## 2024-01-14 DIAGNOSIS — Z2989 Encounter for other specified prophylactic measures: Secondary | ICD-10-CM

## 2024-01-14 DIAGNOSIS — C61 Malignant neoplasm of prostate: Secondary | ICD-10-CM

## 2024-01-14 DIAGNOSIS — Z792 Long term (current) use of antibiotics: Secondary | ICD-10-CM

## 2024-01-14 MED ORDER — LEVOFLOXACIN 500 MG PO TABS
500.0000 mg | ORAL_TABLET | Freq: Once | ORAL | Status: AC
  Administered 2024-01-14: 500 mg via ORAL

## 2024-01-14 MED ORDER — GENTAMICIN SULFATE 40 MG/ML IJ SOLN
80.0000 mg | Freq: Once | INTRAMUSCULAR | Status: AC
  Administered 2024-01-14: 80 mg via INTRAMUSCULAR

## 2024-01-16 ENCOUNTER — Ambulatory Visit
Admission: RE | Admit: 2024-01-16 | Discharge: 2024-01-16 | Disposition: A | Discharge status: Home / Self Care | Payer: 59 | Source: Ambulatory Visit | Attending: Radiation Oncology | Admitting: Radiation Oncology

## 2024-01-16 DIAGNOSIS — M109 Gout, unspecified: Secondary | ICD-10-CM | POA: Diagnosis not present

## 2024-01-16 DIAGNOSIS — Z8673 Personal history of transient ischemic attack (TIA), and cerebral infarction without residual deficits: Secondary | ICD-10-CM | POA: Diagnosis not present

## 2024-01-16 DIAGNOSIS — E785 Hyperlipidemia, unspecified: Secondary | ICD-10-CM | POA: Diagnosis not present

## 2024-01-16 DIAGNOSIS — I10 Essential (primary) hypertension: Secondary | ICD-10-CM | POA: Insufficient documentation

## 2024-01-16 DIAGNOSIS — Z7982 Long term (current) use of aspirin: Secondary | ICD-10-CM | POA: Insufficient documentation

## 2024-01-16 DIAGNOSIS — I2510 Atherosclerotic heart disease of native coronary artery without angina pectoris: Secondary | ICD-10-CM | POA: Insufficient documentation

## 2024-01-16 DIAGNOSIS — A6920 Lyme disease, unspecified: Secondary | ICD-10-CM | POA: Insufficient documentation

## 2024-01-16 DIAGNOSIS — Z8042 Family history of malignant neoplasm of prostate: Secondary | ICD-10-CM | POA: Insufficient documentation

## 2024-01-16 DIAGNOSIS — Z923 Personal history of irradiation: Secondary | ICD-10-CM | POA: Diagnosis not present

## 2024-01-16 DIAGNOSIS — Z87891 Personal history of nicotine dependence: Secondary | ICD-10-CM | POA: Insufficient documentation

## 2024-01-16 DIAGNOSIS — Z79899 Other long term (current) drug therapy: Secondary | ICD-10-CM | POA: Insufficient documentation

## 2024-01-16 DIAGNOSIS — C61 Malignant neoplasm of prostate: Secondary | ICD-10-CM | POA: Insufficient documentation

## 2024-01-16 NOTE — Progress Notes (Signed)
 01/14/24 CC: gold seed markers  HPI: 73 y.o. male with prostate cancer who presents today for placement of fiducial seed markers in anticipation of his upcoming IMRT with Dr. Rushie Chestnut.  He is currently on Orgovyx.  Prostate Gold Seed Marker Placement Procedure   Informed consent was obtained after discussing risks/benefits of the procedure.  A time out was performed to ensure correct patient identity.  Pre-Procedure: - Gentamicin given prophylactically - PO Levaquin 500 mg also given today  Procedure: -Lidocaine jelly was administered per rectum -Rectal ultrasound probe was placed without difficulty and the prostate visualized - 3 fiducial gold seed markers placed, one at right base, one at left base, one at apex of prostate gland under transrectal ultrasound guidance  Post-Procedure: - Patient tolerated the procedure well - He was counseled to seek immediate medical attention if experiences any severe pain, significant bleeding, or fevers   F/u 6 months with PSA  Vanna Scotland, MD

## 2024-01-21 DIAGNOSIS — Z8042 Family history of malignant neoplasm of prostate: Secondary | ICD-10-CM | POA: Insufficient documentation

## 2024-01-21 DIAGNOSIS — Z8673 Personal history of transient ischemic attack (TIA), and cerebral infarction without residual deficits: Secondary | ICD-10-CM | POA: Insufficient documentation

## 2024-01-21 DIAGNOSIS — Z923 Personal history of irradiation: Secondary | ICD-10-CM | POA: Insufficient documentation

## 2024-01-21 DIAGNOSIS — Z7982 Long term (current) use of aspirin: Secondary | ICD-10-CM | POA: Insufficient documentation

## 2024-01-21 DIAGNOSIS — I2510 Atherosclerotic heart disease of native coronary artery without angina pectoris: Secondary | ICD-10-CM | POA: Insufficient documentation

## 2024-01-21 DIAGNOSIS — Z87891 Personal history of nicotine dependence: Secondary | ICD-10-CM | POA: Insufficient documentation

## 2024-01-21 DIAGNOSIS — Z510 Encounter for antineoplastic radiation therapy: Secondary | ICD-10-CM | POA: Diagnosis present

## 2024-01-21 DIAGNOSIS — I10 Essential (primary) hypertension: Secondary | ICD-10-CM | POA: Insufficient documentation

## 2024-01-21 DIAGNOSIS — E785 Hyperlipidemia, unspecified: Secondary | ICD-10-CM | POA: Insufficient documentation

## 2024-01-21 DIAGNOSIS — Z79899 Other long term (current) drug therapy: Secondary | ICD-10-CM | POA: Insufficient documentation

## 2024-01-21 DIAGNOSIS — A6920 Lyme disease, unspecified: Secondary | ICD-10-CM | POA: Insufficient documentation

## 2024-01-21 DIAGNOSIS — M109 Gout, unspecified: Secondary | ICD-10-CM | POA: Insufficient documentation

## 2024-01-21 DIAGNOSIS — C61 Malignant neoplasm of prostate: Secondary | ICD-10-CM | POA: Diagnosis not present

## 2024-01-22 ENCOUNTER — Telehealth: Payer: Self-pay

## 2024-01-22 NOTE — Telephone Encounter (Signed)
 Patient states he started having hot flashes and sweats a month ago and this has intensified in the last month, started after starting to take Orgovyx. Patient would like to know if there is something he can take to help with these symptoms, or any other medications he can take? Will this get better on its own?  Patient is aware Dr Apolinar Junes is out of the office until next week.

## 2024-01-24 ENCOUNTER — Other Ambulatory Visit: Payer: Self-pay | Admitting: *Deleted

## 2024-01-24 DIAGNOSIS — C61 Malignant neoplasm of prostate: Secondary | ICD-10-CM

## 2024-01-27 ENCOUNTER — Encounter: Payer: Self-pay | Admitting: Urology

## 2024-01-27 ENCOUNTER — Ambulatory Visit
Admission: RE | Admit: 2024-01-27 | Discharge: 2024-01-27 | Disposition: A | Discharge status: Home / Self Care | Source: Ambulatory Visit | Attending: Radiation Oncology | Admitting: Radiation Oncology

## 2024-01-28 ENCOUNTER — Other Ambulatory Visit: Payer: Self-pay

## 2024-01-28 ENCOUNTER — Ambulatory Visit
Admission: RE | Admit: 2024-01-28 | Discharge: 2024-01-28 | Disposition: A | Discharge status: Home / Self Care | Source: Ambulatory Visit | Attending: Radiation Oncology | Admitting: Radiation Oncology

## 2024-01-28 DIAGNOSIS — Z510 Encounter for antineoplastic radiation therapy: Secondary | ICD-10-CM | POA: Diagnosis not present

## 2024-01-28 LAB — RAD ONC ARIA SESSION SUMMARY
Course Elapsed Days: 0
Plan Fractions Treated to Date: 1
Plan Prescribed Dose Per Fraction: 2 Gy
Plan Total Fractions Prescribed: 40
Plan Total Prescribed Dose: 80 Gy
Reference Point Dosage Given to Date: 2 Gy
Reference Point Session Dosage Given: 2 Gy
Session Number: 1

## 2024-01-29 ENCOUNTER — Ambulatory Visit
Admission: RE | Admit: 2024-01-29 | Discharge: 2024-01-29 | Disposition: A | Discharge status: Home / Self Care | Source: Ambulatory Visit | Attending: Radiation Oncology | Admitting: Radiation Oncology

## 2024-01-29 ENCOUNTER — Other Ambulatory Visit: Payer: Self-pay

## 2024-01-29 DIAGNOSIS — Z510 Encounter for antineoplastic radiation therapy: Secondary | ICD-10-CM | POA: Diagnosis not present

## 2024-01-29 LAB — RAD ONC ARIA SESSION SUMMARY
Course Elapsed Days: 1
Plan Fractions Treated to Date: 2
Plan Prescribed Dose Per Fraction: 2 Gy
Plan Total Fractions Prescribed: 40
Plan Total Prescribed Dose: 80 Gy
Reference Point Dosage Given to Date: 4 Gy
Reference Point Session Dosage Given: 2 Gy
Session Number: 2

## 2024-01-30 ENCOUNTER — Other Ambulatory Visit: Payer: Self-pay

## 2024-01-30 ENCOUNTER — Ambulatory Visit
Admission: RE | Admit: 2024-01-30 | Discharge: 2024-01-30 | Disposition: A | Discharge status: Home / Self Care | Source: Ambulatory Visit | Attending: Radiation Oncology | Admitting: Radiation Oncology

## 2024-01-30 DIAGNOSIS — Z510 Encounter for antineoplastic radiation therapy: Secondary | ICD-10-CM | POA: Diagnosis not present

## 2024-01-30 LAB — RAD ONC ARIA SESSION SUMMARY
Course Elapsed Days: 2
Plan Fractions Treated to Date: 3
Plan Prescribed Dose Per Fraction: 2 Gy
Plan Total Fractions Prescribed: 40
Plan Total Prescribed Dose: 80 Gy
Reference Point Dosage Given to Date: 6 Gy
Reference Point Session Dosage Given: 2 Gy
Session Number: 3

## 2024-01-31 ENCOUNTER — Ambulatory Visit
Admission: RE | Admit: 2024-01-31 | Discharge: 2024-01-31 | Disposition: A | Discharge status: Home / Self Care | Source: Ambulatory Visit | Attending: Radiation Oncology | Admitting: Radiation Oncology

## 2024-01-31 ENCOUNTER — Other Ambulatory Visit: Payer: Self-pay

## 2024-01-31 DIAGNOSIS — Z510 Encounter for antineoplastic radiation therapy: Secondary | ICD-10-CM | POA: Diagnosis not present

## 2024-01-31 LAB — RAD ONC ARIA SESSION SUMMARY
Course Elapsed Days: 3
Plan Fractions Treated to Date: 4
Plan Prescribed Dose Per Fraction: 2 Gy
Plan Total Fractions Prescribed: 40
Plan Total Prescribed Dose: 80 Gy
Reference Point Dosage Given to Date: 8 Gy
Reference Point Session Dosage Given: 2 Gy
Session Number: 4

## 2024-02-03 ENCOUNTER — Inpatient Hospital Stay: Attending: Oncology

## 2024-02-03 ENCOUNTER — Other Ambulatory Visit: Payer: Self-pay

## 2024-02-03 ENCOUNTER — Ambulatory Visit
Admission: RE | Admit: 2024-02-03 | Discharge: 2024-02-03 | Disposition: A | Discharge status: Home / Self Care | Source: Ambulatory Visit | Attending: Radiation Oncology | Admitting: Radiation Oncology

## 2024-02-03 DIAGNOSIS — C61 Malignant neoplasm of prostate: Secondary | ICD-10-CM | POA: Insufficient documentation

## 2024-02-03 DIAGNOSIS — Z510 Encounter for antineoplastic radiation therapy: Secondary | ICD-10-CM | POA: Insufficient documentation

## 2024-02-03 LAB — RAD ONC ARIA SESSION SUMMARY
Course Elapsed Days: 6
Plan Fractions Treated to Date: 5
Plan Prescribed Dose Per Fraction: 2 Gy
Plan Total Fractions Prescribed: 40
Plan Total Prescribed Dose: 80 Gy
Reference Point Dosage Given to Date: 10 Gy
Reference Point Session Dosage Given: 2 Gy
Session Number: 5

## 2024-02-04 ENCOUNTER — Other Ambulatory Visit: Payer: Self-pay

## 2024-02-04 ENCOUNTER — Ambulatory Visit
Admission: RE | Admit: 2024-02-04 | Discharge: 2024-02-04 | Disposition: A | Discharge status: Home / Self Care | Source: Ambulatory Visit | Attending: Radiation Oncology | Admitting: Radiation Oncology

## 2024-02-04 DIAGNOSIS — Z510 Encounter for antineoplastic radiation therapy: Secondary | ICD-10-CM | POA: Diagnosis not present

## 2024-02-04 LAB — RAD ONC ARIA SESSION SUMMARY
Course Elapsed Days: 7
Plan Fractions Treated to Date: 6
Plan Prescribed Dose Per Fraction: 2 Gy
Plan Total Fractions Prescribed: 40
Plan Total Prescribed Dose: 80 Gy
Reference Point Dosage Given to Date: 12 Gy
Reference Point Session Dosage Given: 2 Gy
Session Number: 6

## 2024-02-04 LAB — PSA: Prostatic Specific Antigen: 2.64 ng/mL (ref 0.00–4.00)

## 2024-02-05 ENCOUNTER — Other Ambulatory Visit: Payer: Self-pay

## 2024-02-05 ENCOUNTER — Ambulatory Visit
Admission: RE | Admit: 2024-02-05 | Discharge: 2024-02-05 | Disposition: A | Discharge status: Home / Self Care | Source: Ambulatory Visit | Attending: Radiation Oncology | Admitting: Radiation Oncology

## 2024-02-05 ENCOUNTER — Other Ambulatory Visit: Payer: Self-pay | Admitting: *Deleted

## 2024-02-05 DIAGNOSIS — Z510 Encounter for antineoplastic radiation therapy: Secondary | ICD-10-CM | POA: Diagnosis not present

## 2024-02-05 LAB — RAD ONC ARIA SESSION SUMMARY
Course Elapsed Days: 8
Plan Fractions Treated to Date: 7
Plan Prescribed Dose Per Fraction: 2 Gy
Plan Total Fractions Prescribed: 40
Plan Total Prescribed Dose: 80 Gy
Reference Point Dosage Given to Date: 14 Gy
Reference Point Session Dosage Given: 2 Gy
Session Number: 7

## 2024-02-05 MED ORDER — CIPROFLOXACIN HCL 500 MG PO TABS: 500.0000 mg | ORAL_TABLET | Freq: Two times a day (BID) | ORAL | 0 refills | Status: DC

## 2024-02-06 ENCOUNTER — Other Ambulatory Visit: Payer: Self-pay

## 2024-02-06 ENCOUNTER — Ambulatory Visit
Admission: RE | Admit: 2024-02-06 | Discharge: 2024-02-06 | Disposition: A | Discharge status: Home / Self Care | Source: Ambulatory Visit | Attending: Radiation Oncology | Admitting: Radiation Oncology

## 2024-02-06 DIAGNOSIS — Z510 Encounter for antineoplastic radiation therapy: Secondary | ICD-10-CM | POA: Diagnosis not present

## 2024-02-06 LAB — RAD ONC ARIA SESSION SUMMARY
Course Elapsed Days: 9
Plan Fractions Treated to Date: 8
Plan Prescribed Dose Per Fraction: 2 Gy
Plan Total Fractions Prescribed: 40
Plan Total Prescribed Dose: 80 Gy
Reference Point Dosage Given to Date: 16 Gy
Reference Point Session Dosage Given: 2 Gy
Session Number: 8

## 2024-02-07 ENCOUNTER — Ambulatory Visit
Admission: RE | Admit: 2024-02-07 | Discharge: 2024-02-07 | Disposition: A | Discharge status: Home / Self Care | Source: Ambulatory Visit | Attending: Radiation Oncology | Admitting: Radiation Oncology

## 2024-02-07 ENCOUNTER — Other Ambulatory Visit: Payer: Self-pay

## 2024-02-07 DIAGNOSIS — Z510 Encounter for antineoplastic radiation therapy: Secondary | ICD-10-CM | POA: Diagnosis not present

## 2024-02-07 LAB — RAD ONC ARIA SESSION SUMMARY
Course Elapsed Days: 10
Plan Fractions Treated to Date: 9
Plan Prescribed Dose Per Fraction: 2 Gy
Plan Total Fractions Prescribed: 40
Plan Total Prescribed Dose: 80 Gy
Reference Point Dosage Given to Date: 18 Gy
Reference Point Session Dosage Given: 2 Gy
Session Number: 9

## 2024-02-10 ENCOUNTER — Other Ambulatory Visit: Payer: Self-pay

## 2024-02-10 ENCOUNTER — Ambulatory Visit
Admission: RE | Admit: 2024-02-10 | Discharge: 2024-02-10 | Disposition: A | Discharge status: Home / Self Care | Source: Ambulatory Visit | Attending: Radiation Oncology | Admitting: Radiation Oncology

## 2024-02-10 DIAGNOSIS — Z510 Encounter for antineoplastic radiation therapy: Secondary | ICD-10-CM | POA: Diagnosis not present

## 2024-02-10 LAB — RAD ONC ARIA SESSION SUMMARY
Course Elapsed Days: 13
Plan Fractions Treated to Date: 10
Plan Prescribed Dose Per Fraction: 2 Gy
Plan Total Fractions Prescribed: 40
Plan Total Prescribed Dose: 80 Gy
Reference Point Dosage Given to Date: 20 Gy
Reference Point Session Dosage Given: 2 Gy
Session Number: 10

## 2024-02-11 ENCOUNTER — Ambulatory Visit
Admission: RE | Admit: 2024-02-11 | Discharge: 2024-02-11 | Disposition: A | Discharge status: Home / Self Care | Source: Ambulatory Visit | Attending: Radiation Oncology | Admitting: Radiation Oncology

## 2024-02-11 ENCOUNTER — Other Ambulatory Visit: Payer: Self-pay

## 2024-02-11 DIAGNOSIS — Z510 Encounter for antineoplastic radiation therapy: Secondary | ICD-10-CM | POA: Diagnosis not present

## 2024-02-11 LAB — RAD ONC ARIA SESSION SUMMARY
Course Elapsed Days: 14
Plan Fractions Treated to Date: 11
Plan Prescribed Dose Per Fraction: 2 Gy
Plan Total Fractions Prescribed: 40
Plan Total Prescribed Dose: 80 Gy
Reference Point Dosage Given to Date: 22 Gy
Reference Point Session Dosage Given: 2 Gy
Session Number: 11

## 2024-02-12 ENCOUNTER — Ambulatory Visit
Admission: RE | Admit: 2024-02-12 | Discharge: 2024-02-12 | Disposition: A | Discharge status: Home / Self Care | Source: Ambulatory Visit | Attending: Radiation Oncology | Admitting: Radiation Oncology

## 2024-02-12 ENCOUNTER — Other Ambulatory Visit: Payer: Self-pay

## 2024-02-12 DIAGNOSIS — Z510 Encounter for antineoplastic radiation therapy: Secondary | ICD-10-CM | POA: Diagnosis not present

## 2024-02-12 LAB — RAD ONC ARIA SESSION SUMMARY
Course Elapsed Days: 15
Plan Fractions Treated to Date: 12
Plan Prescribed Dose Per Fraction: 2 Gy
Plan Total Fractions Prescribed: 40
Plan Total Prescribed Dose: 80 Gy
Reference Point Dosage Given to Date: 24 Gy
Reference Point Session Dosage Given: 2 Gy
Session Number: 12

## 2024-02-13 ENCOUNTER — Other Ambulatory Visit: Payer: Self-pay

## 2024-02-13 ENCOUNTER — Ambulatory Visit
Admission: RE | Admit: 2024-02-13 | Discharge: 2024-02-13 | Disposition: A | Discharge status: Home / Self Care | Source: Ambulatory Visit | Attending: Radiation Oncology | Admitting: Radiation Oncology

## 2024-02-13 DIAGNOSIS — Z510 Encounter for antineoplastic radiation therapy: Secondary | ICD-10-CM | POA: Diagnosis not present

## 2024-02-13 LAB — RAD ONC ARIA SESSION SUMMARY
Course Elapsed Days: 16
Plan Fractions Treated to Date: 13
Plan Prescribed Dose Per Fraction: 2 Gy
Plan Total Fractions Prescribed: 40
Plan Total Prescribed Dose: 80 Gy
Reference Point Dosage Given to Date: 26 Gy
Reference Point Session Dosage Given: 2 Gy
Session Number: 13

## 2024-02-14 ENCOUNTER — Other Ambulatory Visit: Payer: Self-pay

## 2024-02-14 ENCOUNTER — Ambulatory Visit
Admission: RE | Admit: 2024-02-14 | Discharge: 2024-02-14 | Disposition: A | Discharge status: Home / Self Care | Source: Ambulatory Visit | Attending: Radiation Oncology | Admitting: Radiation Oncology

## 2024-02-14 DIAGNOSIS — Z510 Encounter for antineoplastic radiation therapy: Secondary | ICD-10-CM | POA: Diagnosis not present

## 2024-02-14 LAB — RAD ONC ARIA SESSION SUMMARY
Course Elapsed Days: 17
Plan Fractions Treated to Date: 14
Plan Prescribed Dose Per Fraction: 2 Gy
Plan Total Fractions Prescribed: 40
Plan Total Prescribed Dose: 80 Gy
Reference Point Dosage Given to Date: 28 Gy
Reference Point Session Dosage Given: 2 Gy
Session Number: 14

## 2024-02-17 ENCOUNTER — Inpatient Hospital Stay

## 2024-02-17 ENCOUNTER — Other Ambulatory Visit: Payer: Self-pay | Admitting: *Deleted

## 2024-02-17 ENCOUNTER — Other Ambulatory Visit: Payer: Self-pay

## 2024-02-17 ENCOUNTER — Ambulatory Visit
Admission: RE | Admit: 2024-02-17 | Discharge: 2024-02-17 | Disposition: A | Discharge status: Home / Self Care | Source: Ambulatory Visit | Attending: Radiation Oncology | Admitting: Radiation Oncology

## 2024-02-17 DIAGNOSIS — C61 Malignant neoplasm of prostate: Secondary | ICD-10-CM

## 2024-02-17 DIAGNOSIS — Z510 Encounter for antineoplastic radiation therapy: Secondary | ICD-10-CM | POA: Diagnosis not present

## 2024-02-17 LAB — RAD ONC ARIA SESSION SUMMARY
Course Elapsed Days: 20
Plan Fractions Treated to Date: 15
Plan Prescribed Dose Per Fraction: 2 Gy
Plan Total Fractions Prescribed: 40
Plan Total Prescribed Dose: 80 Gy
Reference Point Dosage Given to Date: 30 Gy
Reference Point Session Dosage Given: 2 Gy
Session Number: 15

## 2024-02-17 LAB — CBC (CANCER CENTER ONLY)
HCT: 34 % — ABNORMAL LOW (ref 39.0–52.0)
Hemoglobin: 11.8 g/dL — ABNORMAL LOW (ref 13.0–17.0)
MCH: 30.6 pg (ref 26.0–34.0)
MCHC: 34.7 g/dL (ref 30.0–36.0)
MCV: 88.3 fL (ref 80.0–100.0)
Platelet Count: 210 10*3/uL (ref 150–400)
RBC: 3.85 MIL/uL — ABNORMAL LOW (ref 4.22–5.81)
RDW: 13 % (ref 11.5–15.5)
WBC Count: 6.5 10*3/uL (ref 4.0–10.5)
nRBC: 0 % (ref 0.0–0.2)

## 2024-02-17 LAB — PSA: Prostatic Specific Antigen: 0.75 ng/mL (ref 0.00–4.00)

## 2024-02-18 ENCOUNTER — Ambulatory Visit
Admission: RE | Admit: 2024-02-18 | Discharge: 2024-02-18 | Disposition: A | Discharge status: Home / Self Care | Source: Ambulatory Visit | Attending: Radiation Oncology | Admitting: Radiation Oncology

## 2024-02-18 ENCOUNTER — Other Ambulatory Visit: Payer: Self-pay

## 2024-02-18 DIAGNOSIS — Z510 Encounter for antineoplastic radiation therapy: Secondary | ICD-10-CM | POA: Diagnosis not present

## 2024-02-18 LAB — RAD ONC ARIA SESSION SUMMARY
Course Elapsed Days: 21
Plan Fractions Treated to Date: 16
Plan Prescribed Dose Per Fraction: 2 Gy
Plan Total Fractions Prescribed: 40
Plan Total Prescribed Dose: 80 Gy
Reference Point Dosage Given to Date: 32 Gy
Reference Point Session Dosage Given: 2 Gy
Session Number: 16

## 2024-02-19 ENCOUNTER — Other Ambulatory Visit: Payer: Self-pay

## 2024-02-19 ENCOUNTER — Ambulatory Visit
Admission: RE | Admit: 2024-02-19 | Discharge: 2024-02-19 | Disposition: A | Discharge status: Home / Self Care | Source: Ambulatory Visit | Attending: Radiation Oncology | Admitting: Radiation Oncology

## 2024-02-19 DIAGNOSIS — Z510 Encounter for antineoplastic radiation therapy: Secondary | ICD-10-CM | POA: Diagnosis not present

## 2024-02-19 LAB — RAD ONC ARIA SESSION SUMMARY
Course Elapsed Days: 22
Plan Fractions Treated to Date: 17
Plan Prescribed Dose Per Fraction: 2 Gy
Plan Total Fractions Prescribed: 40
Plan Total Prescribed Dose: 80 Gy
Reference Point Dosage Given to Date: 34 Gy
Reference Point Session Dosage Given: 2 Gy
Session Number: 17

## 2024-02-20 ENCOUNTER — Ambulatory Visit
Admission: RE | Admit: 2024-02-20 | Discharge: 2024-02-20 | Disposition: A | Discharge status: Home / Self Care | Source: Ambulatory Visit | Attending: Radiation Oncology | Admitting: Radiation Oncology

## 2024-02-20 ENCOUNTER — Other Ambulatory Visit: Payer: Self-pay

## 2024-02-20 DIAGNOSIS — A6920 Lyme disease, unspecified: Secondary | ICD-10-CM | POA: Insufficient documentation

## 2024-02-20 DIAGNOSIS — C61 Malignant neoplasm of prostate: Secondary | ICD-10-CM | POA: Insufficient documentation

## 2024-02-20 DIAGNOSIS — M109 Gout, unspecified: Secondary | ICD-10-CM | POA: Insufficient documentation

## 2024-02-20 DIAGNOSIS — Z923 Personal history of irradiation: Secondary | ICD-10-CM | POA: Insufficient documentation

## 2024-02-20 DIAGNOSIS — Z79899 Other long term (current) drug therapy: Secondary | ICD-10-CM | POA: Insufficient documentation

## 2024-02-20 DIAGNOSIS — I10 Essential (primary) hypertension: Secondary | ICD-10-CM | POA: Insufficient documentation

## 2024-02-20 DIAGNOSIS — Z8673 Personal history of transient ischemic attack (TIA), and cerebral infarction without residual deficits: Secondary | ICD-10-CM | POA: Insufficient documentation

## 2024-02-20 DIAGNOSIS — Z8042 Family history of malignant neoplasm of prostate: Secondary | ICD-10-CM | POA: Insufficient documentation

## 2024-02-20 DIAGNOSIS — E785 Hyperlipidemia, unspecified: Secondary | ICD-10-CM | POA: Insufficient documentation

## 2024-02-20 DIAGNOSIS — Z7982 Long term (current) use of aspirin: Secondary | ICD-10-CM | POA: Insufficient documentation

## 2024-02-20 DIAGNOSIS — Z87891 Personal history of nicotine dependence: Secondary | ICD-10-CM | POA: Insufficient documentation

## 2024-02-20 DIAGNOSIS — I2510 Atherosclerotic heart disease of native coronary artery without angina pectoris: Secondary | ICD-10-CM | POA: Insufficient documentation

## 2024-02-20 LAB — RAD ONC ARIA SESSION SUMMARY
Course Elapsed Days: 23
Plan Fractions Treated to Date: 18
Plan Prescribed Dose Per Fraction: 2 Gy
Plan Total Fractions Prescribed: 40
Plan Total Prescribed Dose: 80 Gy
Reference Point Dosage Given to Date: 36 Gy
Reference Point Session Dosage Given: 2 Gy
Session Number: 18

## 2024-02-21 ENCOUNTER — Other Ambulatory Visit: Payer: Self-pay

## 2024-02-21 ENCOUNTER — Ambulatory Visit
Admission: RE | Admit: 2024-02-21 | Discharge: 2024-02-21 | Disposition: A | Discharge status: Home / Self Care | Source: Ambulatory Visit | Attending: Radiation Oncology | Admitting: Radiation Oncology

## 2024-02-21 DIAGNOSIS — C61 Malignant neoplasm of prostate: Secondary | ICD-10-CM | POA: Diagnosis not present

## 2024-02-21 LAB — RAD ONC ARIA SESSION SUMMARY
Course Elapsed Days: 24
Plan Fractions Treated to Date: 19
Plan Prescribed Dose Per Fraction: 2 Gy
Plan Total Fractions Prescribed: 40
Plan Total Prescribed Dose: 80 Gy
Reference Point Dosage Given to Date: 38 Gy
Reference Point Session Dosage Given: 2 Gy
Session Number: 19

## 2024-02-24 ENCOUNTER — Ambulatory Visit
Admission: RE | Admit: 2024-02-24 | Discharge: 2024-02-24 | Disposition: A | Discharge status: Home / Self Care | Source: Ambulatory Visit | Attending: Radiation Oncology | Admitting: Radiation Oncology

## 2024-02-24 ENCOUNTER — Other Ambulatory Visit: Payer: Self-pay

## 2024-02-24 DIAGNOSIS — C61 Malignant neoplasm of prostate: Secondary | ICD-10-CM | POA: Diagnosis not present

## 2024-02-24 LAB — RAD ONC ARIA SESSION SUMMARY
Course Elapsed Days: 27
Plan Fractions Treated to Date: 20
Plan Prescribed Dose Per Fraction: 2 Gy
Plan Total Fractions Prescribed: 40
Plan Total Prescribed Dose: 80 Gy
Reference Point Dosage Given to Date: 40 Gy
Reference Point Session Dosage Given: 2 Gy
Session Number: 20

## 2024-02-25 ENCOUNTER — Ambulatory Visit
Admission: RE | Admit: 2024-02-25 | Discharge: 2024-02-25 | Disposition: A | Discharge status: Home / Self Care | Source: Ambulatory Visit | Attending: Radiation Oncology | Admitting: Radiation Oncology

## 2024-02-25 ENCOUNTER — Other Ambulatory Visit: Payer: Self-pay

## 2024-02-25 DIAGNOSIS — C61 Malignant neoplasm of prostate: Secondary | ICD-10-CM | POA: Diagnosis not present

## 2024-02-25 LAB — RAD ONC ARIA SESSION SUMMARY
Course Elapsed Days: 28
Plan Fractions Treated to Date: 21
Plan Prescribed Dose Per Fraction: 2 Gy
Plan Total Fractions Prescribed: 40
Plan Total Prescribed Dose: 80 Gy
Reference Point Dosage Given to Date: 42 Gy
Reference Point Session Dosage Given: 2 Gy
Session Number: 21

## 2024-02-26 ENCOUNTER — Other Ambulatory Visit: Payer: Self-pay

## 2024-02-26 ENCOUNTER — Ambulatory Visit
Admission: RE | Admit: 2024-02-26 | Discharge: 2024-02-26 | Disposition: A | Discharge status: Home / Self Care | Source: Ambulatory Visit | Attending: Radiation Oncology | Admitting: Radiation Oncology

## 2024-02-26 DIAGNOSIS — C61 Malignant neoplasm of prostate: Secondary | ICD-10-CM | POA: Diagnosis not present

## 2024-02-26 LAB — RAD ONC ARIA SESSION SUMMARY
Course Elapsed Days: 29
Plan Fractions Treated to Date: 22
Plan Prescribed Dose Per Fraction: 2 Gy
Plan Total Fractions Prescribed: 40
Plan Total Prescribed Dose: 80 Gy
Reference Point Dosage Given to Date: 44 Gy
Reference Point Session Dosage Given: 2 Gy
Session Number: 22

## 2024-02-27 ENCOUNTER — Encounter: Payer: Self-pay | Admitting: Urology

## 2024-02-27 ENCOUNTER — Ambulatory Visit: Admitting: Urology

## 2024-02-27 ENCOUNTER — Other Ambulatory Visit: Payer: Self-pay

## 2024-02-27 ENCOUNTER — Ambulatory Visit
Admission: RE | Admit: 2024-02-27 | Discharge: 2024-02-27 | Disposition: A | Discharge status: Home / Self Care | Source: Ambulatory Visit | Attending: Radiation Oncology | Admitting: Radiation Oncology

## 2024-02-27 VITALS — BP 130/68 | HR 80

## 2024-02-27 DIAGNOSIS — N3281 Overactive bladder: Secondary | ICD-10-CM

## 2024-02-27 DIAGNOSIS — C61 Malignant neoplasm of prostate: Secondary | ICD-10-CM

## 2024-02-27 DIAGNOSIS — N400 Enlarged prostate without lower urinary tract symptoms: Secondary | ICD-10-CM

## 2024-02-27 DIAGNOSIS — R351 Nocturia: Secondary | ICD-10-CM | POA: Diagnosis not present

## 2024-02-27 LAB — RAD ONC ARIA SESSION SUMMARY
Course Elapsed Days: 30
Plan Fractions Treated to Date: 23
Plan Prescribed Dose Per Fraction: 2 Gy
Plan Total Fractions Prescribed: 40
Plan Total Prescribed Dose: 80 Gy
Reference Point Dosage Given to Date: 46 Gy
Reference Point Session Dosage Given: 2 Gy
Session Number: 23

## 2024-02-27 LAB — URINALYSIS, COMPLETE

## 2024-02-27 LAB — MICROSCOPIC EXAMINATION

## 2024-02-27 LAB — BLADDER SCAN AMB NON-IMAGING: Scan Result: 44

## 2024-02-27 MED ORDER — SILODOSIN 8 MG PO CAPS: 8.0000 mg | ORAL_CAPSULE | Freq: Every day | ORAL | 3 refills | Status: DC

## 2024-02-27 MED ORDER — TROSPIUM CHLORIDE 20 MG PO TABS: 20.0000 mg | ORAL_TABLET | Freq: Every day | ORAL | 3 refills | Status: DC

## 2024-02-27 NOTE — Progress Notes (Signed)
 02/27/2024 9:33 AM   Todd Collins May 21, 1951 [id number]  Referring provider: Jimmy Moulding, MD [address] [facility] [place],  [place] [address]  Urological history: 1.  Prostate cancer - PSA (01/2024) 0.75 - Low-volume intermediate risk prostate cancer - IMRT      Chief Complaint  Patient presents with   Follow-up    HPI: Todd Collins is a 73 y.o. man who presents today for pain and frequency.    Previous records reviewed.   He is having issues with hesitancy, nocturia and intermittency and sometimes strain so hard he has bowel movements.  This has been occurring since he underwent cystoscopy in July 2024.  The nocturia had been occurring for a longer time.  He does have untreated sleep apnea.  He states when he gets the urge to urinate, " it feels like he is holding his urine for 2 hours."  He has been taking AZO twice daily for 2 weeks without relief, he has also been taking AZO cranberry pills and drinking cranberry juice.  He stated at one time he had a few beers and a 2 stiffed drinks and it made his symptoms a lot worse.  He is also having pain when he experiences the urge to urinate and it is relieved by urinating.  He states that he also becomes anxious when he feels the urge to void and he cannot start a stream immediately.   Patient denies any modifying or aggravating factors.  Patient denies any recent UTI's, gross hematuria or suprapubic/flank pain.  Patient denies any fevers, chills, nausea or vomiting.    UA dip not performed, WBC 0-5, 0-2 RBCs, 0-10 epithelial cells, granular casts present, hyaline cast present, mucus threads present and moderate bacteria.  PVR 44 mL   He had been on tamsulosin  in the past, but he states when he is on that medication and in the sunlight everything turns white visually, so he discontinue the medication.  PMH: Past Medical History:  Diagnosis Date   CAD (coronary artery disease)    a.  12/2010 s/p 2 vessel CABG x 2 @ [facility] (LIMA-LAD, SVG-PDA); b. cath 03/2011: LM 20%, mLAD 75%, pD1 100% (s/p PCI/DES), mildly diseaed LCx, mRCA 80%, dRCA 95%, patent grafts; c. 12/2015 Ex MV: ex time 7:15, max HR 153.  EF 69%, 1mm lat ST  dep, no ischemia.   Depression    Diastolic dysfunction    a. 05/2022 Echo: EF 60-65%, no rwma, GrII DD, nl RV fxn, RVSP 33.25mmHg, mild AS.   Gout    Hyperlipidemia    Hypertension    Lyme disease    Prostate cancer (HCC)    Statin intolerance    TIA (transient ischemic attack) 07/2013    Surgical History: Past Surgical History:  Procedure Laterality Date   CARDIAC CATHETERIZATION  12/25/2010   CORONARY ANGIOPLASTY     stent x 1    CORONARY ARTERY BYPASS GRAFT  11/2010   [facility]   right index finger surgery      Home Medications:  Allergies as of 02/27/2024       Reactions   Definity  [perflutren  Lipid Microsphere] Other (See Comments)   Patient experiences severe low back pain for 30-45 seconds after injection   Tamsulosin  Shortness Of Breath   Carvedilol  Itching, Swelling   Bupropion Other (See Comments)   Other reaction(s): RASH Upper extremity edema Other reaction(s): RASH Upper extremity edema Other reaction(s): RASH   Omeprazole Other (  See Comments)   Other reaction(s): Other (See Comments) Caused muscle cramps Other reaction(s): Other (See Comments) Caused muscle cramps   Prednisone Other (See Comments)   Elevated blood sugar Other reaction(s): SHORTNESS OF BREATH Elevated blood sugars Elevated blood sugar Other reaction(s): SHORTNESS OF BREATH   Statins    Other reaction(s): Muscle Pain   Trazodone Other (See Comments)   Shortness of breath  wheezing   Trazodone And Nefazodone    Shortness of breath  wheezing        Medication List        Accurate as of Feb 27, 2024  9:33 AM. If you have any questions, ask your nurse or doctor.          STOP taking these medications    ciprofloxacin  500 MG tablet Commonly known as:  Cipro  Stopped by: Shravan Salahuddin   lisinopril -hydrochlorothiazide  20-12.5 MG tablet Commonly known as: ZESTORETIC  Stopped by: Reyes Aldaco   metoprolol  tartrate 50 MG tablet Commonly known as: LOPRESSOR  Stopped by: Keimya Briddell       TAKE these medications    allopurinol 150 mg Tabs tablet Commonly known as: ZYLOPRIM Take 150 mg by mouth daily.   aspirin EC 81 MG tablet Take 81 mg by mouth daily.   Fish Oil 1000 MG Caps Take by mouth 2 (two) times daily.   nitroGLYCERIN  0.4 MG SL tablet Commonly known as: NITROSTAT  Place 1 tablet (0.4 mg total) under the tongue every 5 (five) minutes as needed for chest pain.   Orgovyx  120 MG tablet Generic drug: relugolix  Take 3 tablets at once and then take 1 tablet once daily thereafter   silodosin 8 MG Caps capsule Commonly known as: RAPAFLO Take 1 capsule (8 mg total) by mouth daily with breakfast. Started by: Cathleen Coach Isley Zinni   trospium 20 MG tablet Commonly known as: SANCTURA Take 1 tablet (20 mg total) by mouth at bedtime. Started by: Matilde Son   VITAMIN B12 PO Take 1,000 mcg by mouth daily.        Allergies:  Allergies  Allergen Reactions   Definity  [Perflutren  Lipid Microsphere] Other (See Comments)    Patient experiences severe low back pain for 30-45 seconds after injection   Tamsulosin  Shortness Of Breath   Carvedilol  Itching and Swelling   Bupropion Other (See Comments)    Other reaction(s): RASH Upper extremity edema Other reaction(s): RASH Upper extremity edema Other reaction(s): RASH   Omeprazole Other (See Comments)    Other reaction(s): Other (See Comments) Caused muscle cramps Other reaction(s): Other (See Comments) Caused muscle cramps   Prednisone Other (See Comments)    Elevated blood sugar  Other reaction(s): SHORTNESS OF BREATH Elevated blood sugars Elevated blood sugar Other reaction(s): SHORTNESS OF BREATH   Statins     Other reaction(s): Muscle Pain   Trazodone Other  (See Comments)    Shortness of breath  wheezing   Trazodone And Nefazodone     Shortness of breath  wheezing    Family History: Family History  Problem Relation Age of Onset   Heart attack Father 99    Social History:  reports that he quit smoking about 46 years ago. His smoking use included cigarettes. He has been exposed to tobacco smoke. He has never used smokeless tobacco. He reports current alcohol use of about 2.0 standard drinks of alcohol per week. He reports that he does not use drugs.  ROS: Pertinent ROS in HPI  Physical Exam: BP 130/68   Pulse 80   Constitutional:  Well nourished. Alert and oriented, No acute distress. HEENT: [place] AT, moist mucus membranes.  Trachea midline Cardiovascular: No clubbing, cyanosis, or edema. Respiratory: Normal respiratory effort, no increased work of breathing. Neurologic: Grossly intact, no focal deficits, moving all 4 extremities. Psychiatric: Normal mood and affect.  Laboratory Data: Lab Results  Component Value Date   WBC 6.5 02/17/2024   HGB 11.8 (L) 02/17/2024   HCT 34.0 (L) 02/17/2024   MCV 88.3 02/17/2024   PLT 210 02/17/2024   Urinalysis See EPIC and HPI  I have reviewed the labs.   Pertinent Imaging:  02/27/24 09:05  Scan Result 44 ml   Assessment & Plan:    1. Nocturia - Urinalysis, Complete w/ moderate bacteria - CULTURE, URINE COMPREHENSIVE pending  - BLADDER SCAN AMB NON-IMAGING demonstrates adequate bladder emptying - Explained with untreated sleep apnea this will worsen any episodes of nocturia, but we will go ahead and try Sanctura IR 20 mg nightly to see if we can calm the bladder down so he can get a good night sleep  2. Benign prostatic hyperplasia, unspecified whether lower urinary tract symptoms present - Urinalysis, Complete w/ moderate bacteria - CULTURE, URINE COMPREHENSIVE pending to rule out any indolent infection that may be irritating his bladder - BLADDER SCAN AMB NON-IMAGING demonstrates  adequate bladder emptying - Given prescription for Rapaflo 8 mg daily to see if we can relax his bladder sphincter and prostate capsule allowing for a stronger stream  3. OAB - I advised him to stop the AZO and the cranberry tablets and the cranberry juice - I explained that cranberry tablets and cranberry juice may be irritating his bladder and making his symptoms worse - Will start with Sanctura IR 20 mg at night and reassess after 30 days  4. Prostate cancer - Patient currently undergoing IMRT   Return for Keep follow up appointments .  These notes generated with voice recognition software. I apologize for typographical errors.  Briant Camper  [facility] [address]  [address] [place], [place] [address] [phone]

## 2024-02-28 ENCOUNTER — Ambulatory Visit
Admission: RE | Admit: 2024-02-28 | Discharge: 2024-02-28 | Disposition: A | Discharge status: Home / Self Care | Source: Ambulatory Visit | Attending: Radiation Oncology | Admitting: Radiation Oncology

## 2024-02-28 ENCOUNTER — Other Ambulatory Visit: Payer: Self-pay

## 2024-02-28 DIAGNOSIS — C61 Malignant neoplasm of prostate: Secondary | ICD-10-CM | POA: Diagnosis not present

## 2024-02-28 LAB — RAD ONC ARIA SESSION SUMMARY
Course Elapsed Days: 31
Plan Fractions Treated to Date: 24
Plan Prescribed Dose Per Fraction: 2 Gy
Plan Total Fractions Prescribed: 40
Plan Total Prescribed Dose: 80 Gy
Reference Point Dosage Given to Date: 48 Gy
Reference Point Session Dosage Given: 2 Gy
Session Number: 24

## 2024-03-02 ENCOUNTER — Ambulatory Visit
Admission: RE | Admit: 2024-03-02 | Discharge: 2024-03-02 | Disposition: A | Discharge status: Home / Self Care | Source: Ambulatory Visit | Attending: Radiation Oncology | Admitting: Radiation Oncology

## 2024-03-02 ENCOUNTER — Other Ambulatory Visit: Payer: Self-pay

## 2024-03-02 ENCOUNTER — Telehealth: Payer: Self-pay

## 2024-03-02 ENCOUNTER — Inpatient Hospital Stay

## 2024-03-02 DIAGNOSIS — C61 Malignant neoplasm of prostate: Secondary | ICD-10-CM | POA: Diagnosis not present

## 2024-03-02 LAB — RAD ONC ARIA SESSION SUMMARY
Course Elapsed Days: 34
Plan Fractions Treated to Date: 25
Plan Prescribed Dose Per Fraction: 2 Gy
Plan Total Fractions Prescribed: 40
Plan Total Prescribed Dose: 80 Gy
Reference Point Dosage Given to Date: 50 Gy
Reference Point Session Dosage Given: 2 Gy
Session Number: 25

## 2024-03-02 NOTE — Telephone Encounter (Signed)
 Pt called the triage line stating he has been having some side effects on the medications he started on Friday. Pt states he started silodosin  and trospium  on Friday. On Saturday afternoon he started having swelling on the eyelids, palm of hands were itching and his stomach muscles felt tense. Pt states he only stopped trospium , took benadryl  and the swelling and itching went away. He continued to take the silodosin  because it is helping him but he experiencing shortness of breath, hoarse voice, pupils not dilating. Pt is on Orgovyx . Please advise  Pt was advise to stop taking both medication and to go to the ER if he is having difficulty breathing. Pt voiced understanding.

## 2024-03-03 ENCOUNTER — Other Ambulatory Visit: Payer: Self-pay | Admitting: *Deleted

## 2024-03-03 ENCOUNTER — Other Ambulatory Visit: Payer: Self-pay

## 2024-03-03 ENCOUNTER — Inpatient Hospital Stay: Attending: Oncology

## 2024-03-03 ENCOUNTER — Ambulatory Visit
Admission: RE | Admit: 2024-03-03 | Discharge: 2024-03-03 | Disposition: A | Discharge status: Home / Self Care | Source: Ambulatory Visit | Attending: Radiation Oncology | Admitting: Radiation Oncology

## 2024-03-03 DIAGNOSIS — Z8042 Family history of malignant neoplasm of prostate: Secondary | ICD-10-CM | POA: Insufficient documentation

## 2024-03-03 DIAGNOSIS — M109 Gout, unspecified: Secondary | ICD-10-CM | POA: Insufficient documentation

## 2024-03-03 DIAGNOSIS — C61 Malignant neoplasm of prostate: Secondary | ICD-10-CM | POA: Diagnosis not present

## 2024-03-03 DIAGNOSIS — I10 Essential (primary) hypertension: Secondary | ICD-10-CM | POA: Insufficient documentation

## 2024-03-03 DIAGNOSIS — A6920 Lyme disease, unspecified: Secondary | ICD-10-CM | POA: Insufficient documentation

## 2024-03-03 DIAGNOSIS — Z7982 Long term (current) use of aspirin: Secondary | ICD-10-CM | POA: Insufficient documentation

## 2024-03-03 DIAGNOSIS — I2510 Atherosclerotic heart disease of native coronary artery without angina pectoris: Secondary | ICD-10-CM | POA: Insufficient documentation

## 2024-03-03 DIAGNOSIS — E785 Hyperlipidemia, unspecified: Secondary | ICD-10-CM | POA: Insufficient documentation

## 2024-03-03 DIAGNOSIS — Z8673 Personal history of transient ischemic attack (TIA), and cerebral infarction without residual deficits: Secondary | ICD-10-CM | POA: Insufficient documentation

## 2024-03-03 DIAGNOSIS — Z87891 Personal history of nicotine dependence: Secondary | ICD-10-CM | POA: Insufficient documentation

## 2024-03-03 DIAGNOSIS — Z79899 Other long term (current) drug therapy: Secondary | ICD-10-CM | POA: Insufficient documentation

## 2024-03-03 DIAGNOSIS — Z923 Personal history of irradiation: Secondary | ICD-10-CM | POA: Insufficient documentation

## 2024-03-03 LAB — CBC (CANCER CENTER ONLY)
HCT: 31.3 % — ABNORMAL LOW (ref 39.0–52.0)
Hemoglobin: 11.1 g/dL — ABNORMAL LOW (ref 13.0–17.0)
MCH: 30.8 pg (ref 26.0–34.0)
MCHC: 35.5 g/dL (ref 30.0–36.0)
MCV: 86.9 fL (ref 80.0–100.0)
Platelet Count: 208 10*3/uL (ref 150–400)
RBC: 3.6 MIL/uL — ABNORMAL LOW (ref 4.22–5.81)
RDW: 12.7 % (ref 11.5–15.5)
WBC Count: 8.5 10*3/uL (ref 4.0–10.5)
nRBC: 0 % (ref 0.0–0.2)

## 2024-03-03 LAB — RAD ONC ARIA SESSION SUMMARY
Course Elapsed Days: 35
Plan Fractions Treated to Date: 26
Plan Prescribed Dose Per Fraction: 2 Gy
Plan Total Fractions Prescribed: 40
Plan Total Prescribed Dose: 80 Gy
Reference Point Dosage Given to Date: 52 Gy
Reference Point Session Dosage Given: 2 Gy
Session Number: 26

## 2024-03-03 LAB — CULTURE, URINE COMPREHENSIVE

## 2024-03-03 NOTE — Telephone Encounter (Signed)
 Patient called and stated that his hands were itching last night, and today he has skin blistering and peeling on both hands. He is asking how long silodosin  and trospium  stay in your system, and what do you advise him to do.

## 2024-03-04 ENCOUNTER — Other Ambulatory Visit: Payer: Self-pay

## 2024-03-04 ENCOUNTER — Ambulatory Visit
Admission: RE | Admit: 2024-03-04 | Discharge: 2024-03-04 | Disposition: A | Discharge status: Home / Self Care | Source: Ambulatory Visit | Attending: Radiation Oncology | Admitting: Radiation Oncology

## 2024-03-04 DIAGNOSIS — C61 Malignant neoplasm of prostate: Secondary | ICD-10-CM | POA: Diagnosis not present

## 2024-03-04 LAB — RAD ONC ARIA SESSION SUMMARY
Course Elapsed Days: 36
Plan Fractions Treated to Date: 27
Plan Prescribed Dose Per Fraction: 2 Gy
Plan Total Fractions Prescribed: 40
Plan Total Prescribed Dose: 80 Gy
Reference Point Dosage Given to Date: 54 Gy
Reference Point Session Dosage Given: 2 Gy
Session Number: 27

## 2024-03-04 NOTE — Telephone Encounter (Signed)
 Pt states his hands are doing better. Blisters are the size of a quarter.  It is tolerable. He is urinating ok. SOB is better.   Pt stated that his H&H (ordered by [facility]) was really low and he thinks this is why he is not doing any better at this time. Pt states Dr. Jacalyn Martin has not contacted him regarding his labs or f/u.   Pt states he does not need a urology f/u at this time. If something changes he will contact office for an appt.

## 2024-03-05 ENCOUNTER — Ambulatory Visit
Admission: RE | Admit: 2024-03-05 | Discharge: 2024-03-05 | Disposition: A | Discharge status: Home / Self Care | Source: Ambulatory Visit | Attending: Radiation Oncology | Admitting: Radiation Oncology

## 2024-03-05 ENCOUNTER — Other Ambulatory Visit: Payer: Self-pay

## 2024-03-05 DIAGNOSIS — C61 Malignant neoplasm of prostate: Secondary | ICD-10-CM | POA: Diagnosis not present

## 2024-03-05 LAB — RAD ONC ARIA SESSION SUMMARY
Course Elapsed Days: 37
Plan Fractions Treated to Date: 28
Plan Prescribed Dose Per Fraction: 2 Gy
Plan Total Fractions Prescribed: 40
Plan Total Prescribed Dose: 80 Gy
Reference Point Dosage Given to Date: 56 Gy
Reference Point Session Dosage Given: 2 Gy
Session Number: 28

## 2024-03-06 ENCOUNTER — Ambulatory Visit
Admission: RE | Admit: 2024-03-06 | Discharge: 2024-03-06 | Disposition: A | Discharge status: Home / Self Care | Source: Ambulatory Visit | Attending: Radiation Oncology | Admitting: Radiation Oncology

## 2024-03-06 ENCOUNTER — Other Ambulatory Visit: Payer: Self-pay

## 2024-03-06 DIAGNOSIS — C61 Malignant neoplasm of prostate: Secondary | ICD-10-CM | POA: Diagnosis not present

## 2024-03-06 LAB — RAD ONC ARIA SESSION SUMMARY
Course Elapsed Days: 38
Plan Fractions Treated to Date: 29
Plan Prescribed Dose Per Fraction: 2 Gy
Plan Total Fractions Prescribed: 40
Plan Total Prescribed Dose: 80 Gy
Reference Point Dosage Given to Date: 58 Gy
Reference Point Session Dosage Given: 2 Gy
Session Number: 29

## 2024-03-09 ENCOUNTER — Other Ambulatory Visit: Payer: Self-pay

## 2024-03-09 ENCOUNTER — Ambulatory Visit
Admission: RE | Admit: 2024-03-09 | Discharge: 2024-03-09 | Disposition: A | Discharge status: Home / Self Care | Source: Ambulatory Visit | Attending: Radiation Oncology | Admitting: Radiation Oncology

## 2024-03-09 DIAGNOSIS — C61 Malignant neoplasm of prostate: Secondary | ICD-10-CM | POA: Diagnosis not present

## 2024-03-09 LAB — RAD ONC ARIA SESSION SUMMARY
Course Elapsed Days: 41
Plan Fractions Treated to Date: 30
Plan Prescribed Dose Per Fraction: 2 Gy
Plan Total Fractions Prescribed: 40
Plan Total Prescribed Dose: 80 Gy
Reference Point Dosage Given to Date: 60 Gy
Reference Point Session Dosage Given: 2 Gy
Session Number: 30

## 2024-03-10 ENCOUNTER — Other Ambulatory Visit: Payer: Self-pay

## 2024-03-10 ENCOUNTER — Ambulatory Visit
Admission: RE | Admit: 2024-03-10 | Discharge: 2024-03-10 | Disposition: A | Discharge status: Home / Self Care | Source: Ambulatory Visit | Attending: Radiation Oncology | Admitting: Radiation Oncology

## 2024-03-10 DIAGNOSIS — C61 Malignant neoplasm of prostate: Secondary | ICD-10-CM | POA: Diagnosis not present

## 2024-03-10 LAB — RAD ONC ARIA SESSION SUMMARY
Course Elapsed Days: 42
Plan Fractions Treated to Date: 31
Plan Prescribed Dose Per Fraction: 2 Gy
Plan Total Fractions Prescribed: 40
Plan Total Prescribed Dose: 80 Gy
Reference Point Dosage Given to Date: 62 Gy
Reference Point Session Dosage Given: 2 Gy
Session Number: 31

## 2024-03-11 ENCOUNTER — Other Ambulatory Visit: Payer: Self-pay

## 2024-03-11 ENCOUNTER — Ambulatory Visit
Admission: RE | Admit: 2024-03-11 | Discharge: 2024-03-11 | Disposition: A | Discharge status: Home / Self Care | Source: Ambulatory Visit | Attending: Radiation Oncology | Admitting: Radiation Oncology

## 2024-03-11 DIAGNOSIS — C61 Malignant neoplasm of prostate: Secondary | ICD-10-CM | POA: Diagnosis not present

## 2024-03-11 LAB — RAD ONC ARIA SESSION SUMMARY
Course Elapsed Days: 43
Plan Fractions Treated to Date: 32
Plan Prescribed Dose Per Fraction: 2 Gy
Plan Total Fractions Prescribed: 40
Plan Total Prescribed Dose: 80 Gy
Reference Point Dosage Given to Date: 64 Gy
Reference Point Session Dosage Given: 2 Gy
Session Number: 32

## 2024-03-12 ENCOUNTER — Other Ambulatory Visit: Payer: Self-pay

## 2024-03-12 ENCOUNTER — Emergency Department

## 2024-03-12 ENCOUNTER — Ambulatory Visit
Admission: RE | Admit: 2024-03-12 | Discharge: 2024-03-12 | Disposition: A | Discharge status: Home / Self Care | Source: Ambulatory Visit | Attending: Radiation Oncology | Admitting: Radiation Oncology

## 2024-03-12 ENCOUNTER — Emergency Department
Admission: EM | Admit: 2024-03-12 | Discharge: 2024-03-12 | Disposition: A | Discharge status: Home / Self Care | Attending: Emergency Medicine | Admitting: Emergency Medicine

## 2024-03-12 ENCOUNTER — Encounter: Payer: Self-pay | Admitting: Emergency Medicine

## 2024-03-12 DIAGNOSIS — M25561 Pain in right knee: Secondary | ICD-10-CM | POA: Diagnosis not present

## 2024-03-12 DIAGNOSIS — Z8546 Personal history of malignant neoplasm of prostate: Secondary | ICD-10-CM | POA: Insufficient documentation

## 2024-03-12 DIAGNOSIS — I10 Essential (primary) hypertension: Secondary | ICD-10-CM | POA: Diagnosis not present

## 2024-03-12 DIAGNOSIS — Z951 Presence of aortocoronary bypass graft: Secondary | ICD-10-CM | POA: Insufficient documentation

## 2024-03-12 DIAGNOSIS — R531 Weakness: Secondary | ICD-10-CM | POA: Diagnosis not present

## 2024-03-12 DIAGNOSIS — M25562 Pain in left knee: Secondary | ICD-10-CM | POA: Diagnosis not present

## 2024-03-12 DIAGNOSIS — I2510 Atherosclerotic heart disease of native coronary artery without angina pectoris: Secondary | ICD-10-CM | POA: Insufficient documentation

## 2024-03-12 DIAGNOSIS — R300 Dysuria: Secondary | ICD-10-CM | POA: Diagnosis present

## 2024-03-12 DIAGNOSIS — C61 Malignant neoplasm of prostate: Secondary | ICD-10-CM | POA: Diagnosis not present

## 2024-03-12 LAB — RAD ONC ARIA SESSION SUMMARY
Course Elapsed Days: 44
Plan Fractions Treated to Date: 33
Plan Prescribed Dose Per Fraction: 2 Gy
Plan Total Fractions Prescribed: 40
Plan Total Prescribed Dose: 80 Gy
Reference Point Dosage Given to Date: 66 Gy
Reference Point Session Dosage Given: 2 Gy
Session Number: 33

## 2024-03-12 LAB — URINALYSIS, ROUTINE W REFLEX MICROSCOPIC
Bilirubin Urine: NEGATIVE
Glucose, UA: NEGATIVE mg/dL
Hgb urine dipstick: NEGATIVE
Ketones, ur: NEGATIVE mg/dL
Leukocytes,Ua: NEGATIVE
Nitrite: POSITIVE — AB
Protein, ur: NEGATIVE mg/dL
Specific Gravity, Urine: 1.018 (ref 1.005–1.030)
pH: 5 (ref 5.0–8.0)

## 2024-03-12 LAB — CBC
HCT: 31.1 % — ABNORMAL LOW (ref 39.0–52.0)
Hemoglobin: 11.1 g/dL — ABNORMAL LOW (ref 13.0–17.0)
MCH: 31.4 pg (ref 26.0–34.0)
MCHC: 35.7 g/dL (ref 30.0–36.0)
MCV: 87.9 fL (ref 80.0–100.0)
Platelets: 198 10*3/uL (ref 150–400)
RBC: 3.54 MIL/uL — ABNORMAL LOW (ref 4.22–5.81)
RDW: 12.8 % (ref 11.5–15.5)
WBC: 11.6 10*3/uL — ABNORMAL HIGH (ref 4.0–10.5)
nRBC: 0 % (ref 0.0–0.2)

## 2024-03-12 LAB — BASIC METABOLIC PANEL WITH GFR
Anion gap: 10 (ref 5–15)
BUN: 39 mg/dL — ABNORMAL HIGH (ref 8–23)
CO2: 17 mmol/L — ABNORMAL LOW (ref 22–32)
Calcium: 9.4 mg/dL (ref 8.9–10.3)
Chloride: 107 mmol/L (ref 98–111)
Creatinine, Ser: 1.56 mg/dL — ABNORMAL HIGH (ref 0.61–1.24)
GFR, Estimated: 47 mL/min — ABNORMAL LOW (ref >60)
Glucose, Bld: 97 mg/dL (ref 70–99)
Potassium: 5.3 mmol/L — ABNORMAL HIGH (ref 3.5–5.1)
Sodium: 134 mmol/L — ABNORMAL LOW (ref 135–145)

## 2024-03-12 LAB — RESP PANEL BY RT-PCR (RSV, FLU A&B, COVID)  RVPGX2
Influenza A by PCR: NEGATIVE
Influenza B by PCR: NEGATIVE
Resp Syncytial Virus by PCR: NEGATIVE
SARS Coronavirus 2 by RT PCR: NEGATIVE

## 2024-03-12 MED ORDER — SODIUM ZIRCONIUM CYCLOSILICATE 10 G PO PACK
10.0000 g | PACK | Freq: Once | ORAL | Status: AC
  Administered 2024-03-12: 10 g via ORAL
  Filled 2024-03-12: qty 1

## 2024-03-12 MED ORDER — SODIUM CHLORIDE 0.9 % IV BOLUS
1000.0000 mL | Freq: Once | INTRAVENOUS | Status: AC
  Administered 2024-03-12: 1000 mL via INTRAVENOUS

## 2024-03-12 NOTE — ED Provider Notes (Signed)
 [facility] Provider Note    Event Date/Time   First MD Initiated Contact with Patient 03/12/24 1825     (approximate)   History   Chief Complaint Urinary Frequency   HPI  Todd Collins is a 73 y.o. male with past medical history of hypertension, hyperlipidemia, CAD status post CABG, and prostate cancer who presents to the ED complaining of urinary frequency.  Patient reports that he has been dealing with urinary frequency and dysuria for about the past month while receiving radiation for prostate cancer.  He states that the symptoms were worse today and he began to have some subjective fevers and chills, did not check his temperature.  He reports feeling generally weak with pain in both of his knees, denies trauma to his lower extremities.  He has had a cough but denies any chest pain or shortness of breath.  He is not aware of any sick contacts.     Physical Exam   Triage Vital Signs: ED Triage Vitals  Encounter Vitals Group     BP 03/12/24 1517 107/74     Systolic BP Percentile --      Diastolic BP Percentile --      Pulse Rate 03/12/24 1517 97     Resp 03/12/24 1517 18     Temp 03/12/24 1517 98.1 F (36.7 C)     Temp Source 03/12/24 1517 Oral     SpO2 03/12/24 1517 98 %     Weight 03/12/24 1516 220 lb (99.8 kg)     Height 03/12/24 1516 5' 8.5" (1.74 m)     Head Circumference --      Peak Flow --      Pain Score 03/12/24 1823 3     Pain Loc --      Pain Education --      Exclude from Growth Chart --     Most recent vital signs: Vitals:   03/12/24 1517 03/12/24 1823  BP: 107/74 (!) 114/51  Pulse: 97 69  Resp: 18 18  Temp: 98.1 F (36.7 C) 98.5 F (36.9 C)  SpO2: 98% 98%    Constitutional: Alert and oriented. Eyes: Conjunctivae are normal. Head: Atraumatic. Nose: No congestion/rhinnorhea. Mouth/Throat: Mucous membranes are moist.  Cardiovascular: Normal rate, regular rhythm. Grossly normal heart sounds.  2+ radial pulses  bilaterally. Respiratory: Normal respiratory effort.  No retractions. Lungs CTAB. Gastrointestinal: Soft and nontender. No distention. Musculoskeletal: No lower extremity tenderness nor edema.  No erythema or edema noted to bilateral knees, able to range both knees without pain. Neurologic:  Normal speech and language. No gross focal neurologic deficits are appreciated.    ED Results / Procedures / Treatments   Labs (all labs ordered are listed, but only abnormal results are displayed) Labs Reviewed  URINALYSIS, ROUTINE W REFLEX MICROSCOPIC - Abnormal; Notable for the following components:      Result Value   Color, Urine AMBER (*)    APPearance CLEAR (*)    Nitrite POSITIVE (*)    Bacteria, UA RARE (*)    All other components within normal limits  BASIC METABOLIC PANEL WITH GFR - Abnormal; Notable for the following components:   Sodium 134 (*)    Potassium 5.3 (*)    CO2 17 (*)    BUN 39 (*)    Creatinine, Ser 1.56 (*)    GFR, Estimated 47 (*)    All other components within normal limits  CBC - Abnormal; Notable for the following  components:   WBC 11.6 (*)    RBC 3.54 (*)    Hemoglobin 11.1 (*)    HCT 31.1 (*)    All other components within normal limits  RESP PANEL BY RT-PCR (RSV, FLU A&B, COVID)  RVPGX2  URINE CULTURE    RADIOLOGY Chest x-ray reviewed and interpreted by me with no infiltrate, edema, or effusion.  PROCEDURES:  Critical Care performed: No  Procedures   MEDICATIONS ORDERED IN ED: Medications  sodium chloride  0.9 % bolus 1,000 mL (1,000 mLs Intravenous New Bag/Given 03/12/24 1837)  sodium zirconium cyclosilicate (LOKELMA) packet 10 g (10 g Oral Given 03/12/24 1900)     IMPRESSION / MDM / ASSESSMENT AND PLAN / ED COURSE  I reviewed the triage vital signs and the nursing notes.                              73 y.o. male with past medical history of hypertension, hyperlipidemia, CAD status post CABG, and prostate cancer who presents to the ED  complaining of urinary frequency and dysuria today along with bilateral knee pain, subjective fevers, and cough.  Patient's presentation is most consistent with acute presentation with potential threat to life or bodily function.  Differential diagnosis includes, but is not limited to, sepsis, UTI, AKI, electrolyte abnormality, pneumonia, viral syndrome, septic arthritis.  Patient nontoxic-appearing and in no acute distress, vital signs are unremarkable.  No fever or other findings concerning for sepsis, urinalysis does not appear concerning for infection and his urinary symptoms could be due to radiation of his prostate.  Labs do show mild AKI with mild hyperkalemia, will give IV fluid bolus and dose of Lokelma.  Mild leukocytosis noted without significant anemia.  We will check chest x-ray and viral panel given cough and subjective fevers.  No findings concerning for septic arthritis, patient reports that his knee pain has resolved.  Chest x-ray is unremarkable, patient feeling better following IV fluids with no findings concerning for sepsis or infectious process.  Urine was sent for culture but dysuria appears to be a chronic issue for him and may be followed up as an outpatient with urology.  He was also counseled to follow-up for repeat labs in about 1 week and to return to the ED for new or worsening symptoms.  Patient agrees with plan.      FINAL CLINICAL IMPRESSION(S) / ED DIAGNOSES   Final diagnoses:  Dysuria  Generalized weakness     Rx / DC Orders   ED Discharge Orders     None        Note:  This document was prepared using Dragon voice recognition software and may include unintentional dictation errors.   Twilla Galea, MD 03/12/24 2017

## 2024-03-12 NOTE — ED Triage Notes (Signed)
 Pt via POV from home. Pt c/o urinary frequency for the past 2 months. Reports that he has a hx of prostate cancer and undergoes radiation.  States his urology gave him so medications about 2 weeks ago. Pt is A&Ox4 and NAD, ambulatory with steady gait to triage.

## 2024-03-13 ENCOUNTER — Ambulatory Visit
Admission: RE | Admit: 2024-03-13 | Discharge: 2024-03-13 | Disposition: A | Discharge status: Home / Self Care | Source: Ambulatory Visit | Attending: Radiation Oncology | Admitting: Radiation Oncology

## 2024-03-13 ENCOUNTER — Encounter: Payer: Self-pay | Admitting: Physician Assistant

## 2024-03-13 ENCOUNTER — Ambulatory Visit (INDEPENDENT_AMBULATORY_CARE_PROVIDER_SITE_OTHER): Admitting: Physician Assistant

## 2024-03-13 ENCOUNTER — Other Ambulatory Visit: Payer: Self-pay

## 2024-03-13 VITALS — BP 105/52 | HR 90 | Temp 97.5°F | Ht 68.5 in | Wt 226.8 lb

## 2024-03-13 DIAGNOSIS — C61 Malignant neoplasm of prostate: Secondary | ICD-10-CM | POA: Diagnosis not present

## 2024-03-13 DIAGNOSIS — N3040 Irradiation cystitis without hematuria: Secondary | ICD-10-CM

## 2024-03-13 DIAGNOSIS — N400 Enlarged prostate without lower urinary tract symptoms: Secondary | ICD-10-CM

## 2024-03-13 LAB — RAD ONC ARIA SESSION SUMMARY
Course Elapsed Days: 45
Plan Fractions Treated to Date: 34
Plan Prescribed Dose Per Fraction: 2 Gy
Plan Total Fractions Prescribed: 40
Plan Total Prescribed Dose: 80 Gy
Reference Point Dosage Given to Date: 68 Gy
Reference Point Session Dosage Given: 2 Gy
Session Number: 34

## 2024-03-13 LAB — MICROSCOPIC EXAMINATION: Epithelial Cells (non renal): >10 /HPF — AB (ref 0–10)

## 2024-03-13 LAB — URINALYSIS, COMPLETE
Bilirubin, UA: NEGATIVE
Glucose, UA: NEGATIVE
Ketones, UA: NEGATIVE
Nitrite, UA: NEGATIVE
RBC, UA: NEGATIVE
Specific Gravity, UA: 1.03 (ref 1.005–1.030)
Urobilinogen, Ur: 0.2 mg/dL (ref 0.2–1.0)
pH, UA: 5.5 (ref 5.0–7.5)

## 2024-03-13 LAB — BLADDER SCAN AMB NON-IMAGING

## 2024-03-13 LAB — URINE CULTURE: Culture: <10000 — AB

## 2024-03-13 MED ORDER — TADALAFIL 5 MG PO TABS: 5.0000 mg | ORAL_TABLET | Freq: Every day | ORAL | 0 refills | Status: DC

## 2024-03-13 NOTE — Progress Notes (Signed)
 03/13/2024 11:04 AM   Todd Collins 08/19/1951 [id number]  CC: Chief Complaint  Patient presents with   Follow-up    From the ED   HPI: Todd Collins is a 73 y.o. male with PMH prostate cancer currently undergoing IMRT with Dr. Jacalyn Martin who presents today for evaluation of difficulty urinating.   He was seen in the emergency department yesterday for the same.  UA was nitrate positive in the setting of Azo use, but otherwise bland.  Creatinine was up, 1.56.  White count also up, 11.6.  I do not see that a bladder scan was performed.  Today he reports obstructive voiding symptoms including weak stream, intermittency, and straining as well as intense dysuria since starting radiation treatments.  He has 6 more treatments to go.  Pyridium is not helping.  He denies fevers.  Unfortunately, he is allergic to Flomax  (shortness of breath).  He also had a recent allergic reaction in the setting of trospium  and silodosin .  He has a prescription for nitrates for chest pain, but never uses this.  In-office UA today positive for trace protein and trace leukocytes; urine microscopy with 11-30 WBCs/HPF, >10 epithelial cells/hpf, and many bacteria. PVR 23mL.  PMH: Past Medical History:  Diagnosis Date   CAD (coronary artery disease)    a. 12/2010 s/p 2 vessel CABG x 2 @ [facility] (LIMA-LAD, SVG-PDA); b. cath 03/2011: LM 20%, mLAD 75%, pD1 100% (s/p PCI/DES), mildly diseaed LCx, mRCA 80%, dRCA 95%, patent grafts; c. 12/2015 Ex MV: ex time 7:15, max HR 153.  EF 69%, 1mm lat ST  dep, no ischemia.   Depression    Diastolic dysfunction    a. 05/2022 Echo: EF 60-65%, no rwma, GrII DD, nl RV fxn, RVSP 33.85mmHg, mild AS.   Gout    Hyperlipidemia    Hypertension    Lyme disease    Prostate cancer (HCC)    Statin intolerance    TIA (transient ischemic attack) 07/2013    Surgical History: Past Surgical History:  Procedure Laterality Date   CARDIAC CATHETERIZATION  12/25/2010   CORONARY ANGIOPLASTY      stent x 1    CORONARY ARTERY BYPASS GRAFT  11/2010   [facility]   right index finger surgery      Home Medications:  Allergies as of 03/13/2024       Reactions   Definity  [perflutren  Lipid Microsphere] Other (See Comments)   Patient experiences severe low back pain for 30-45 seconds after injection   Tamsulosin  Shortness Of Breath   Carvedilol  Itching, Swelling   Bupropion Other (See Comments)   Other reaction(s): RASH Upper extremity edema Other reaction(s): RASH Upper extremity edema Other reaction(s): RASH   Omeprazole Other (See Comments)   Other reaction(s): Other (See Comments) Caused muscle cramps Other reaction(s): Other (See Comments) Caused muscle cramps   Prednisone Other (See Comments)   Elevated blood sugar Other reaction(s): SHORTNESS OF BREATH Elevated blood sugars Elevated blood sugar Other reaction(s): SHORTNESS OF BREATH   Statins    Other reaction(s): Muscle Pain   Trazodone Other (See Comments)   Shortness of breath  wheezing   Trazodone And Nefazodone    Shortness of breath  wheezing        Medication List        Accurate as of Mar 13, 2024 11:04 AM. If you have any questions, ask your nurse or doctor.          allopurinol 150 mg Tabs tablet Commonly known as:  ZYLOPRIM Take 150 mg by mouth daily.   aspirin EC 81 MG tablet Take 81 mg by mouth daily.   Fish Oil 1000 MG Caps Take by mouth 2 (two) times daily.   lisinopril  40 MG tablet Commonly known as: ZESTRIL  Take 40 mg by mouth daily.   metoprolol  succinate 50 MG 24 hr tablet Commonly known as: TOPROL -XL Take 50 mg by mouth 2 (two) times daily. Take with or immediately following a meal.   nitroGLYCERIN  0.4 MG SL tablet Commonly known as: NITROSTAT  Place 1 tablet (0.4 mg total) under the tongue every 5 (five) minutes as needed for chest pain.   Orgovyx  120 MG tablet Generic drug: relugolix  Take 3 tablets at once and then take 1 tablet once daily thereafter   silodosin  8 MG  Caps capsule Commonly known as: RAPAFLO  Take 1 capsule (8 mg total) by mouth daily with breakfast.   trospium  20 MG tablet Commonly known as: SANCTURA  Take 1 tablet (20 mg total) by mouth at bedtime.   VITAMIN B12 PO Take 1,000 mcg by mouth daily.        Allergies:  Allergies  Allergen Reactions   Definity  [Perflutren  Lipid Microsphere] Other (See Comments)    Patient experiences severe low back pain for 30-45 seconds after injection   Tamsulosin  Shortness Of Breath   Carvedilol  Itching and Swelling   Bupropion Other (See Comments)    Other reaction(s): RASH Upper extremity edema Other reaction(s): RASH Upper extremity edema Other reaction(s): RASH   Omeprazole Other (See Comments)    Other reaction(s): Other (See Comments) Caused muscle cramps Other reaction(s): Other (See Comments) Caused muscle cramps   Prednisone Other (See Comments)    Elevated blood sugar  Other reaction(s): SHORTNESS OF BREATH Elevated blood sugars Elevated blood sugar Other reaction(s): SHORTNESS OF BREATH   Statins     Other reaction(s): Muscle Pain   Trazodone Other (See Comments)    Shortness of breath  wheezing   Trazodone And Nefazodone     Shortness of breath  wheezing    Family History: Family History  Problem Relation Age of Onset   Heart attack Father 6    Social History:   reports that he quit smoking about 46 years ago. His smoking use included cigarettes. He has been exposed to tobacco smoke. He has never used smokeless tobacco. He reports current alcohol use of about 2.0 standard drinks of alcohol per week. He reports that he does not use drugs.  Physical Exam: BP (!) 105/52   Pulse 90   Temp (!) 97.5 F (36.4 C) (Oral)   Ht 5' 8.5" (1.74 m)   Wt 226 lb 12.8 oz (102.9 kg)   BMI 33.98 kg/m   Constitutional:  Alert and oriented, no acute distress, nontoxic appearing HEENT: [place], AT Cardiovascular: No clubbing, cyanosis, or edema Respiratory: Normal respiratory  effort, no increased work of breathing Skin: No rashes, bruises or suspicious lesions Neurologic: Grossly intact, no focal deficits, moving all 4 extremities Psychiatric: Normal mood and affect  Laboratory Data: Results for orders placed or performed in visit on 03/13/24  Microscopic Examination   Collection Time: 03/13/24 10:54 AM   Urine  Result Value Ref Range   WBC, UA 11-30 (A) 0 - 5 /hpf   RBC, Urine 0-2 0 - 2 /hpf   Epithelial Cells (non renal) >10 (A) 0 - 10 /hpf   Casts Present (A) None seen /lpf   Cast Type Hyaline casts N/A   Bacteria, UA Many (A)  None seen/Few  Urinalysis, Complete   Collection Time: 03/13/24 10:54 AM  Result Value Ref Range   Specific Gravity, UA 1.030 1.005 - 1.030   pH, UA 5.5 5.0 - 7.5   Color, UA Yellow Yellow   Appearance Ur Clear Clear   Leukocytes,UA Trace (A) Negative   Protein,UA Trace Negative/Trace   Glucose, UA Negative Negative   Ketones, UA Negative Negative   RBC, UA Negative Negative   Bilirubin, UA Negative Negative   Urobilinogen, Ur 0.2 0.2 - 1.0 mg/dL   Nitrite, UA Negative Negative   Microscopic Examination See below:   BLADDER SCAN AMB NON-IMAGING   Collection Time: 03/13/24 11:00 AM  Result Value Ref Range   Scan Result    Assessment & Plan:   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  He is emptying appropriately.  UA today is contaminated, low suspicion for UTI given bland UA in the ED yesterday, though will send for culture to confirm.  Unfortunately, managing his symptoms is gena be very challenging in the setting of his allergies.  Fortunately, he never takes his nitrates for chest pain, so we decided to start with low-dose daily tadalafil.  I counseled him on common bladder irritants and asked him to start continuously sipping on water to keep his urine dilute and reduce irritation.  We may need to consider a course of anti-inflammatories next week based  on how he is doing.  I asked him to keep me posted.  We did discuss that I anticipate his symptoms will subside within 1 to 2 weeks of completing his radiation course. - Urinalysis, Complete - BLADDER SCAN AMB NON-IMAGING - tadalafil (CIALIS) 5 MG tablet; Take 1 tablet (5 mg total) by mouth daily.  Dispense: 30 tablet; Refill: 0 - CULTURE, URINE COMPREHENSIVE   Return if symptoms worsen or fail to improve.  Kathreen Pare, PA-C  [facility] [address] [place], [place] [address] [phone]

## 2024-03-13 NOTE — Patient Instructions (Signed)
 Common foods that can worsen bladder pain, pain with urination, or urinary urgency/frequency include:  Caffeine Alcohol Carbonated beverages Chocolate Spicy foods Acidic foods including citrus and tomatoes  Artificial sweeteners

## 2024-03-17 ENCOUNTER — Other Ambulatory Visit: Payer: Self-pay

## 2024-03-17 ENCOUNTER — Ambulatory Visit
Admission: RE | Admit: 2024-03-17 | Discharge: 2024-03-17 | Disposition: A | Discharge status: Home / Self Care | Source: Ambulatory Visit | Attending: Radiation Oncology | Admitting: Radiation Oncology

## 2024-03-17 DIAGNOSIS — C61 Malignant neoplasm of prostate: Secondary | ICD-10-CM | POA: Diagnosis not present

## 2024-03-17 LAB — RAD ONC ARIA SESSION SUMMARY
Course Elapsed Days: 49
Plan Fractions Treated to Date: 35
Plan Prescribed Dose Per Fraction: 2 Gy
Plan Total Fractions Prescribed: 40
Plan Total Prescribed Dose: 80 Gy
Reference Point Dosage Given to Date: 70 Gy
Reference Point Session Dosage Given: 2 Gy
Session Number: 35

## 2024-03-18 ENCOUNTER — Other Ambulatory Visit: Payer: Self-pay

## 2024-03-18 ENCOUNTER — Ambulatory Visit
Admission: RE | Admit: 2024-03-18 | Discharge: 2024-03-18 | Disposition: A | Discharge status: Home / Self Care | Source: Ambulatory Visit | Attending: Radiation Oncology | Admitting: Radiation Oncology

## 2024-03-18 ENCOUNTER — Inpatient Hospital Stay

## 2024-03-18 DIAGNOSIS — C61 Malignant neoplasm of prostate: Secondary | ICD-10-CM | POA: Diagnosis not present

## 2024-03-18 LAB — CBC (CANCER CENTER ONLY)
HCT: 28 % — ABNORMAL LOW (ref 39.0–52.0)
Hemoglobin: 9.7 g/dL — ABNORMAL LOW (ref 13.0–17.0)
MCH: 30.8 pg (ref 26.0–34.0)
MCHC: 34.6 g/dL (ref 30.0–36.0)
MCV: 88.9 fL (ref 80.0–100.0)
Platelet Count: 196 10*3/uL (ref 150–400)
RBC: 3.15 MIL/uL — ABNORMAL LOW (ref 4.22–5.81)
RDW: 12.8 % (ref 11.5–15.5)
WBC Count: 6.4 10*3/uL (ref 4.0–10.5)
nRBC: 0 % (ref 0.0–0.2)

## 2024-03-18 LAB — RAD ONC ARIA SESSION SUMMARY
Course Elapsed Days: 50
Plan Fractions Treated to Date: 36
Plan Prescribed Dose Per Fraction: 2 Gy
Plan Total Fractions Prescribed: 40
Plan Total Prescribed Dose: 80 Gy
Reference Point Dosage Given to Date: 72 Gy
Reference Point Session Dosage Given: 2 Gy
Session Number: 36

## 2024-03-18 LAB — CULTURE, URINE COMPREHENSIVE

## 2024-03-19 ENCOUNTER — Ambulatory Visit
Admission: RE | Admit: 2024-03-19 | Discharge: 2024-03-19 | Disposition: A | Discharge status: Home / Self Care | Source: Ambulatory Visit | Attending: Radiation Oncology | Admitting: Radiation Oncology

## 2024-03-19 ENCOUNTER — Other Ambulatory Visit: Payer: Self-pay

## 2024-03-19 DIAGNOSIS — C61 Malignant neoplasm of prostate: Secondary | ICD-10-CM | POA: Diagnosis not present

## 2024-03-19 LAB — RAD ONC ARIA SESSION SUMMARY
Course Elapsed Days: 51
Plan Fractions Treated to Date: 37
Plan Prescribed Dose Per Fraction: 2 Gy
Plan Total Fractions Prescribed: 40
Plan Total Prescribed Dose: 80 Gy
Reference Point Dosage Given to Date: 74 Gy
Reference Point Session Dosage Given: 2 Gy
Session Number: 37

## 2024-03-20 ENCOUNTER — Other Ambulatory Visit: Payer: Self-pay

## 2024-03-20 ENCOUNTER — Ambulatory Visit
Admission: RE | Admit: 2024-03-20 | Discharge: 2024-03-20 | Disposition: A | Discharge status: Home / Self Care | Source: Ambulatory Visit | Attending: Radiation Oncology | Admitting: Radiation Oncology

## 2024-03-20 DIAGNOSIS — C61 Malignant neoplasm of prostate: Secondary | ICD-10-CM | POA: Diagnosis not present

## 2024-03-20 LAB — RAD ONC ARIA SESSION SUMMARY
Course Elapsed Days: 52
Plan Fractions Treated to Date: 38
Plan Prescribed Dose Per Fraction: 2 Gy
Plan Total Fractions Prescribed: 40
Plan Total Prescribed Dose: 80 Gy
Reference Point Dosage Given to Date: 76 Gy
Reference Point Session Dosage Given: 2 Gy
Session Number: 38

## 2024-03-23 ENCOUNTER — Telehealth: Payer: Self-pay | Admitting: Urology

## 2024-03-23 ENCOUNTER — Ambulatory Visit
Admission: RE | Admit: 2024-03-23 | Discharge: 2024-03-23 | Disposition: A | Discharge status: Home / Self Care | Source: Ambulatory Visit | Attending: Radiation Oncology | Admitting: Radiation Oncology

## 2024-03-23 ENCOUNTER — Other Ambulatory Visit: Payer: Self-pay

## 2024-03-23 DIAGNOSIS — C61 Malignant neoplasm of prostate: Secondary | ICD-10-CM | POA: Insufficient documentation

## 2024-03-23 LAB — RAD ONC ARIA SESSION SUMMARY
Course Elapsed Days: 55
Plan Fractions Treated to Date: 39
Plan Prescribed Dose Per Fraction: 2 Gy
Plan Total Fractions Prescribed: 40
Plan Total Prescribed Dose: 80 Gy
Reference Point Dosage Given to Date: 78 Gy
Reference Point Session Dosage Given: 2 Gy
Session Number: 39

## 2024-03-23 NOTE — Telephone Encounter (Signed)
 Pt called and left VM stating that he needs an increase in his meds so he can be able to pass urine.

## 2024-03-23 NOTE — Telephone Encounter (Signed)
 Patient saw Sam on 03/13/24 and was started on Tadalafil  5 mg daily, he states this medication helped him with not having to strain as much to get urine stream started and the stream came out faster for about 24 to 36 hours of been on the medication. Now it is back to how it was before the medication, straining to urinate, slow stream, burning. All the same symptoms. He wonders if he can increase the dose of the medication or any other suggestions?

## 2024-03-23 NOTE — Telephone Encounter (Signed)
 Okay to try 10 mg daily.  Anticipate he can stop this 2 weeks after he completes radiation months.

## 2024-03-24 ENCOUNTER — Ambulatory Visit

## 2024-03-24 NOTE — Telephone Encounter (Signed)
 LVM for pt to return call

## 2024-03-25 ENCOUNTER — Ambulatory Visit
Admission: RE | Admit: 2024-03-25 | Discharge: 2024-03-25 | Disposition: A | Discharge status: Home / Self Care | Source: Ambulatory Visit | Attending: Radiation Oncology | Admitting: Radiation Oncology

## 2024-03-25 ENCOUNTER — Other Ambulatory Visit: Payer: Self-pay

## 2024-03-25 DIAGNOSIS — C61 Malignant neoplasm of prostate: Secondary | ICD-10-CM | POA: Diagnosis not present

## 2024-03-25 LAB — RAD ONC ARIA SESSION SUMMARY
Course Elapsed Days: 57
Plan Fractions Treated to Date: 40
Plan Prescribed Dose Per Fraction: 2 Gy
Plan Total Fractions Prescribed: 40
Plan Total Prescribed Dose: 80 Gy
Reference Point Dosage Given to Date: 80 Gy
Reference Point Session Dosage Given: 2 Gy
Session Number: 40

## 2024-03-25 NOTE — Telephone Encounter (Signed)
 Patient called back and left message requesting another call back regarding medication.

## 2024-03-26 NOTE — Telephone Encounter (Signed)
 The original plan was for 6 months of Orgovyx , however per Dr. Ace Holder, ok to stop early due to side effects.

## 2024-03-26 NOTE — Radiation Completion Notes (Signed)
 Patient Name: Todd Collins, Todd Collins MRN: [id number] Date of Birth: 10-14-1951 Referring Physician: Dustin Gimenez, M.D. Date of Service: 2024-03-26 Radiation Oncologist: Glenis Langdon, M.D. [facility]                             RADIATION ONCOLOGY END OF TREATMENT NOTE     Diagnosis: C61 Malignant neoplasm of prostate Intent: Curative     HPI: Patient is a pleasant 73 year old male who has been followed for about a year for elevated PSA.  Original biopsy a year ago was for Gleason 6 adenocarcinoma.  PSA at that time was in the 4 range.  Has been elevated recently to the 6 range he underwent MRI scan of his prostate showing a PI-RADS category 3 lesion of the right posterior lateral peripheral zone.  He did have a prominent 1 cm right external iliac lymph node not overtly pathologic.  UroNav targeting showed 4 cores out of 13 positive for adenocarcinoma.  2 of the cores were Gleason 6 (3+3) and 2 cores were Gleason 7 (3+4).  Patient does have a weak stream has tried Flomax  in the past but discontinued secondary to side effects.  He otherwise has very little symptoms.  He is having no bone pain.  He has been seen by urology is now referred to radiation oncology for opinion.      ==========DELIVERED PLANS==========  First Treatment Date: 2024-01-28 Last Treatment Date: 2024-03-25   Plan Name: Prostate Site: Prostate Technique: IMRT Mode: Photon Dose Per Fraction: 2 Gy Prescribed Dose (Delivered / Prescribed): 80 Gy / 80 Gy Prescribed Fxs (Delivered / Prescribed): 40 / 40     ==========ON TREATMENT VISIT DATES========== 2024-01-28, 2024-02-04, 2024-02-11, 2024-02-18, 2024-02-25, 2024-03-03, 2024-03-10, 2024-03-17, 2024-03-25     ==========UPCOMING VISITS========== 04/15/2024 [facility] RAD ONCOLOGY FOLLOW UP 30 Glenis Langdon, MD        ==========APPENDIX - ON TREATMENT VISIT NOTES==========   See weekly On Treatment Notes in Epic for details in the Media  tab (listed as Progress notes on the On Treatment Visit Dates listed above).

## 2024-03-27 ENCOUNTER — Other Ambulatory Visit: Payer: Self-pay | Admitting: Cardiovascular Disease

## 2024-03-27 NOTE — Telephone Encounter (Signed)
 Pt informed. Pt states he will continue to take orgovyx  until next month.

## 2024-04-15 ENCOUNTER — Other Ambulatory Visit: Payer: Self-pay | Admitting: *Deleted

## 2024-04-15 ENCOUNTER — Ambulatory Visit
Admission: RE | Admit: 2024-04-15 | Discharge: 2024-04-15 | Disposition: A | Discharge status: Home / Self Care | Source: Ambulatory Visit | Attending: Radiation Oncology | Admitting: Radiation Oncology

## 2024-04-15 ENCOUNTER — Encounter: Payer: Self-pay | Admitting: Radiation Oncology

## 2024-04-15 VITALS — BP 107/63 | HR 56 | Temp 98.0°F | Resp 16 | Ht 68.5 in | Wt 219.6 lb

## 2024-04-15 DIAGNOSIS — C61 Malignant neoplasm of prostate: Secondary | ICD-10-CM | POA: Diagnosis not present

## 2024-04-15 NOTE — Progress Notes (Signed)
 Radiation Oncology Follow up Note  Name: Todd Collins   Date:   04/15/2024 MRN:  [id number] DOB: 07-18-1951    This 73 y.o. 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  REFERRING PROVIDER: Lenon Layman ORN, MD  HPI: Patient is a 73 year old male now out 1 month having completed image guided IMRT radiation therapy to his prostate for stage IIb Gleason 7 adenocarcinoma seen today in routine follow-up he continues to have some dysuria frequency of urination.  He has Flomax  but has not been taking it.  He also states he has intermittent diarrhea does not take any Imodium does not follow any special low residue diet..  COMPLICATIONS OF TREATMENT: none  FOLLOW UP COMPLIANCE: keeps appointments   PHYSICAL EXAM:  BP 107/63   Pulse (!) 56   Temp 98 F (36.7 C) (Tympanic)   Resp 16   Ht 5' 8.5 (1.74 m)   Wt 219 lb 9.6 oz (99.6 kg)   BMI 32.90 kg/m  Well-developed well-nourished patient in NAD. HEENT reveals PERLA, EOMI, discs not visualized.  Oral cavity is clear. No oral mucosal lesions are identified. Neck is clear without evidence of cervical or supraclavicular adenopathy. Lungs are clear to A&P. Cardiac examination is essentially unremarkable with regular rate and rhythm without murmur rub or thrill. Abdomen is benign with no organomegaly or masses noted. Motor sensory and DTR levels are equal and symmetric in the upper and lower extremities. Cranial nerves II through XII are grossly intact. Proprioception is intact. No peripheral adenopathy or edema is identified. No motor or sensory levels are noted. Crude visual fields are within normal range.  RADIOLOGY RESULTS: No current films to review  PLAN: Present, have requested he start the Flomax  and take it on a daily basis.  I have assured him some the side effects will take a bit longer  to resolve.  I have asked him to contact urology for follow-up with them and if his urinary symptoms persist he can discuss that with them.  I have otherwise asked to see him back in 3 months for follow-up with a PSA prior to that visit.  Patient knows to call sooner with any concerns.  I would like to take this opportunity to thank you for allowing me to participate in the care of your patient.SABRA Marcey Penton, MD

## 2024-04-21 ENCOUNTER — Ambulatory Visit: Attending: Medical | Admitting: Medical

## 2024-04-21 ENCOUNTER — Encounter: Payer: Self-pay | Admitting: Medical

## 2024-04-21 VITALS — BP 100/58 | HR 66 | Ht 68.0 in | Wt 218.6 lb

## 2024-04-21 DIAGNOSIS — E782 Mixed hyperlipidemia: Secondary | ICD-10-CM

## 2024-04-21 DIAGNOSIS — Z79899 Other long term (current) drug therapy: Secondary | ICD-10-CM

## 2024-04-21 DIAGNOSIS — I25119 Atherosclerotic heart disease of native coronary artery with unspecified angina pectoris: Secondary | ICD-10-CM | POA: Diagnosis not present

## 2024-04-21 DIAGNOSIS — R0602 Shortness of breath: Secondary | ICD-10-CM | POA: Diagnosis not present

## 2024-04-21 DIAGNOSIS — Z951 Presence of aortocoronary bypass graft: Secondary | ICD-10-CM

## 2024-04-21 DIAGNOSIS — Z01810 Encounter for preprocedural cardiovascular examination: Secondary | ICD-10-CM

## 2024-04-21 DIAGNOSIS — I10 Essential (primary) hypertension: Secondary | ICD-10-CM

## 2024-04-21 DIAGNOSIS — I350 Nonrheumatic aortic (valve) stenosis: Secondary | ICD-10-CM

## 2024-04-21 MED ORDER — LISINOPRIL 20 MG PO TABS: 20.0000 mg | ORAL_TABLET | Freq: Every day | ORAL | 3 refills | Status: DC

## 2024-04-21 NOTE — Patient Instructions (Addendum)
 Medication Instructions:  Your physician recommends the following medication changes.  DECREASE: Lisinopril  to 20 mg by mouth daily    *If you need a refill on your cardiac medications before your next appointment, please call your pharmacy*  Lab Work: Your provider would like for you to have following labs drawn today CBC, TSH, Mg, BMP.     Testing/Procedures: Your physician has requested that you have an echocardiogram. Echocardiography is a painless test that uses sound waves to create images of your heart. It provides your doctor with information about the size and shape of your heart and how well your heart's chambers and valves are working.   You may receive an ultrasound enhancing agent through an IV if needed to better visualize your heart during the echo. This procedure takes approximately one hour.  There are no restrictions for this procedure.  This will take place at [address] [facility]) [address], [place] [address]  Please note: We ask at that you not bring children with you during ultrasound (echo/ vascular) testing. Due to room size and safety concerns, children are not allowed in the ultrasound rooms during exams. Our front office staff cannot provide observation of children in our lobby area while testing is being conducted. An adult accompanying a patient to their appointment will only be allowed in the ultrasound room at the discretion of the ultrasound technician under special circumstances. We apologize for any inconvenience.   Follow-Up: At [facility], you and your health needs are our priority.  As part of our continuing mission to provide you with exceptional heart care, our providers are all part of one team.  This team includes your primary Cardiologist (physician) and Advanced Practice Providers or APPs (Physician Assistants and Nurse Practitioners) who all work together to provide you with the care you need, when you need it.  Your  next appointment:   3 month(s)  Provider:   Timothy Gollan, MD or Cadence Franchester, PA-C

## 2024-04-21 NOTE — Progress Notes (Unsigned)
 Cardiology Office Note   Date:  04/22/2024  ID:  Todd Collins, DOB 1951-04-09, MRN [id number] PCP: Lenon Layman ORN, MD  [facility] Providers Cardiologist:  Evalene Lunger, MD    History of Present Illness Todd Collins is a 73 y.o. male with a hx of  CAD with MI in 2012 and subsequent two-vessel CABG with LIMA-LAD and SVG-PDA in 2012 s/p subsequent PCI/DES to occluded D1 03/2011, possible TIA, HTN, HLD with statin intolerance, GERD, gout, steroid-induced hyperglycemia presents today for follow-up of CAD.   Previously followed by [facility] cardiology.  Carotid duplex 2012with no hemodynamically significant stenosis.  Cath 12/2010 50% mid LAD disease, 90% small diagonal disease, 50% proximal RCA disease, 60-70% distal RCA disease.  Echo 01/18/2011 EF 60-65.  Two-vessel CABG in 2012.  Spring 2012 recurrent chest pain with stress test 02/2011 with ST depressions greater than 1 mm during end of study but no symptoms.  Follow-up cardiac cath 03/1111 with 20% lesion in the distal left main, diffusely diseased mid LAD up to 75% 100% proximal occlusion the first diagonal with subsequent PCI/DES, mildly diseased LCx and 80% stenosis of mid RCA and a 95% stenosis in distal RCA.  LIMA to LAD and SVG to PDA were patent.  Repeat stress test 2013 with no evidence of ischemia or scar and EF greater than 65%.  He had prior TIA-like symptoms 06/2013 with head CT normal.  At that time echo, Holter unremarkable.  Nuclear stress test 12/2013 with no ischemia with 1 mm ST depression in leads V5 and V6 concerning for ischemia at peak rest.  EF 69%.  Overall low risk scan.   Echocardiogram 04/07/2020 LVEF 60 to 65%, grade 2 diastolic dysfunction, no RWMA, RV normal size and function, mild MR, very mild aortic valve stenosis with valve area 1.86 cm, mean gradient 10.0.   He has a lot of allergies to medications, amlodipine , HCTZ, Coreg , Zetia , statin, Imdur.  The patient was seen 03/2022 and was overall doing well.  Repeat echo for AI showed LVEF 60-65%, G2DD, mild AI.   The patient was last seen 02/04/23 reporting stable SOB on exertion.   Today, the patient reports fatigue and SOB, which may be from cancer medication. This is his last month of the medication (It keeps the testosterone down). He also underwent 40 treatments of radiation. He feels SOB on any activity. He has minimal chest pressure when he feels SOB. He has occasional dependent edema.   Studies Reviewed EKG Interpretation Date/Time:  Tuesday April 21 2024 14:57:16 EDT Ventricular Rate:  66 PR Interval:  186 QRS Duration:  80 QT Interval:  398 QTC Calculation: 417 R Axis:   -1  Text Interpretation: Normal sinus rhythm with sinus arrhythmia Normal ECG When compared with ECG of 24-Oct-2022 13:22, PR interval has decreased Nonspecific T wave abnormality no longer evident in Lateral leads Confirmed by Franchester, Jerrald Doverspike ([id number]) on 04/21/2024 3:13:45 PM    Echo 2023  1. Left ventricular ejection fraction, by estimation, is 60 to 65%. The  left ventricle has normal function. The left ventricle has no regional  wall motion abnormalities. There is mild asymmetric left ventricular  hypertrophy of the basal-septal segment.  Left ventricular diastolic parameters are consistent with Grade II  diastolic dysfunction (pseudonormalization). The average left ventricular  global longitudinal strain is -14.2 %.   2. Right ventricular systolic function is normal. The right ventricular  size is normal. There is normal pulmonary artery systolic pressure. The  estimated right ventricular  systolic pressure is 33.1 mmHg.   3. The mitral valve is normal in structure. No evidence of mitral valve  regurgitation. No evidence of mitral stenosis.   4. The aortic valve is normal in structure. There is moderate  calcification of the aortic valve. Aortic valve regurgitation is not  visualized. vVery mild aortic valve stenosis. Aortic valve area, by VTI  measures 1.88  cm. Aortic valve mean gradient  measures 12.3 mmHg. Aortic valve Vmax measures 2.23 m/s.   5. The inferior vena cava is normal in size with greater than 50%  respiratory variability, suggesting right atrial pressure of 3 mmHg.   Comparison(s): 04/07/2020 LVEF 60 to 65%, grade 2 diastolic dysfunction, no  RWMA, RV normal size and function, mild MR, very mild aortic valve  stenosis with valve area 1.86 cm, mean gradient 10.0.   Echo 03/2020  1. Left ventricular ejection fraction, by estimation, is 60 to 65%. The  left ventricle has normal function. The left ventricle has no regional  wall motion abnormalities. Left ventricular diastolic parameters are  consistent with Grade II diastolic  dysfunction (pseudonormalization).   2. Right ventricular systolic function is normal. The right ventricular  size is normal.   3. Mild mitral valve regurgitation.   4. The aortic valve was not well visualized. Aortic valve regurgitation  is not visualized. Very mild aortic valve stenosis. Aortic valve area, by  VTI measures 1.86 cm. Aortic valve mean gradient measures 10.0 mmHg.    Echo 04/17/19 1. The left ventricle has normal systolic function, with an ejection  fraction of 55-60%. The cavity size was normal. Left ventricular diastolic  parameters were normal. No evidence of left ventricular regional wall  motion abnormalities.   2. The right ventricle has normal systolic function. The cavity was  normal. There is no increase in right ventricular wall thickness. Right  ventricular systolic pressure is upper normal to mildly elevated (30 mmHg  plus central venous pressure).   3. Left atrial size was mildly dilated.   4. Right atrial size was mildly dilated.   5. The aortic valve was not well visualized. Mild thickening of the  aortic valve. Mild-moderate stenosis of the aortic valve.   6. The aortic root is normal in size and structure.   7. The interatrial septum was not well visualized.     Myoview Lexiscan 2017 Narrative & Impression  Horizontal ST segment depression ST segment depression was noted during stress in the II, III, aVF, V3, V4, V5 and V6 leads. V6 beginning at 5:35,remaining leads beginning at 6:25   Exercise myocardial perfusion imaging study with no significant  ischemia Normal wall motion, EF estimated at 69% EKG changes notes, 1 mm depression in V5 and V6, concerning for ischemia at peak stress Target heart rate achieved Low risk scan. Please note EKG abnormality, clinical correlation suggested.     Signed, Velinda Lunger, MD, Ph.D [facility]        Physical Exam VS:  BP (!) 100/58 (BP Location: Left Arm, Patient Position: Sitting, Cuff Size: Normal)   Pulse 66   Ht 5' 8 (1.727 m)   Wt 218 lb 9.6 oz (99.2 kg)   SpO2 98%   BMI 33.24 kg/m        Wt Readings from Last 3 Encounters:  04/21/24 218 lb 9.6 oz (99.2 kg)  04/15/24 219 lb 9.6 oz (99.6 kg)  03/13/24 226 lb 12.8 oz (102.9 kg)    GEN: Well nourished, well developed in no acute  distress NECK: No JVD; No carotid bruits CARDIAC: RRR, + murmur, no rubs, gallops RESPIRATORY:  Clear to auscultation without rales, wheezing or rhonchi  ABDOMEN: Soft, non-tender, non-distended EXTREMITIES:  No edema; No deformity   ASSESSMENT AND PLAN  HTN BP is low today.  He has fatigue and DOE suspected from oncology medication/treatment. He denies dizziness or lightheadedness. I will decrease lisinopril  to 20mg  daily. Continue Lopressor  50mg  BID.   DOE Patient underwent 40 treatments of radiation and is now on another medication for his cancer. Hgb down to 9-10. He denies chest pain. He is euvolemic. I will update an echocardiogram. I will check BMET, CBC, THS and Mag  CAD s/p CABG and subsequent PCI native D1 The patient reports infrequent chest pressure when he feels severely short of breath. We will start with an echo for now. IF symptoms worsen may consider stress test. Continue ASA, BB and SL NTG  PRN.  Mild AS Repeat echo as above.   HLD LDL 133, TG 374, total chol 239. Patient with intolerances to statins and Zetia . No interested in injections.         Dispo: follow-up in 3 months  Signed, Adrijana Haros VEAR Fishman, PA-C

## 2024-04-22 ENCOUNTER — Ambulatory Visit: Payer: Self-pay | Admitting: Medical

## 2024-04-22 DIAGNOSIS — Z79899 Other long term (current) drug therapy: Secondary | ICD-10-CM

## 2024-04-22 LAB — BASIC METABOLIC PANEL WITH GFR
BUN/Creatinine Ratio: 23 (ref 10–24)
BUN: 35 mg/dL — ABNORMAL HIGH (ref 8–27)
CO2: 17 mmol/L — ABNORMAL LOW (ref 20–29)
Calcium: 10.2 mg/dL (ref 8.6–10.2)
Chloride: 103 mmol/L (ref 96–106)
Creatinine, Ser: 1.49 mg/dL — ABNORMAL HIGH (ref 0.76–1.27)
Glucose: 90 mg/dL (ref 70–99)
Potassium: 5.5 mmol/L — ABNORMAL HIGH (ref 3.5–5.2)
Sodium: 136 mmol/L (ref 134–144)
eGFR: 49 mL/min/{1.73_m2} — ABNORMAL LOW (ref >59)

## 2024-04-22 LAB — CBC
Hematocrit: 33.2 % — ABNORMAL LOW (ref 37.5–51.0)
Hemoglobin: 11.5 g/dL — ABNORMAL LOW (ref 13.0–17.7)
MCH: 31.9 pg (ref 26.6–33.0)
MCHC: 34.6 g/dL (ref 31.5–35.7)
MCV: 92 fL (ref 79–97)
Platelets: 219 10*3/uL (ref 150–450)
RBC: 3.6 x10E6/uL — ABNORMAL LOW (ref 4.14–5.80)
RDW: 13 % (ref 11.6–15.4)
WBC: 8.7 10*3/uL (ref 3.4–10.8)

## 2024-04-22 LAB — MAGNESIUM: Magnesium: 1.8 mg/dL (ref 1.6–2.3)

## 2024-04-22 LAB — TSH: TSH: 2.35 u[IU]/mL (ref 0.450–4.500)

## 2024-04-29 ENCOUNTER — Other Ambulatory Visit
Admission: RE | Admit: 2024-04-29 | Discharge: 2024-04-29 | Disposition: A | Discharge status: Home / Self Care | Source: Ambulatory Visit | Attending: Medical | Admitting: Medical

## 2024-04-29 ENCOUNTER — Ambulatory Visit: Payer: Self-pay

## 2024-04-29 DIAGNOSIS — Z79899 Other long term (current) drug therapy: Secondary | ICD-10-CM | POA: Diagnosis present

## 2024-04-29 LAB — BASIC METABOLIC PANEL WITH GFR
Anion gap: 10 (ref 5–15)
BUN: 22 mg/dL (ref 8–23)
CO2: 20 mmol/L — ABNORMAL LOW (ref 22–32)
Calcium: 9.1 mg/dL (ref 8.9–10.3)
Chloride: 106 mmol/L (ref 98–111)
Creatinine, Ser: 1.27 mg/dL — ABNORMAL HIGH (ref 0.61–1.24)
GFR, Estimated: 60 mL/min — ABNORMAL LOW (ref >60)
Glucose, Bld: 111 mg/dL — ABNORMAL HIGH (ref 70–99)
Potassium: 4.1 mmol/L (ref 3.5–5.1)
Sodium: 136 mmol/L (ref 135–145)

## 2024-05-03 ENCOUNTER — Emergency Department

## 2024-05-03 ENCOUNTER — Other Ambulatory Visit: Payer: Self-pay

## 2024-05-03 ENCOUNTER — Emergency Department
Admission: EM | Admit: 2024-05-03 | Discharge: 2024-05-03 | Disposition: A | Discharge status: Home / Self Care | Attending: Emergency Medicine | Admitting: Emergency Medicine

## 2024-05-03 DIAGNOSIS — M545 Low back pain, unspecified: Secondary | ICD-10-CM | POA: Diagnosis present

## 2024-05-03 DIAGNOSIS — M5126 Other intervertebral disc displacement, lumbar region: Secondary | ICD-10-CM | POA: Insufficient documentation

## 2024-05-03 MED ORDER — METHOCARBAMOL 500 MG PO TABS: 500.0000 mg | ORAL_TABLET | Freq: Three times a day (TID) | ORAL | 0 refills | Status: AC | PRN

## 2024-05-03 MED ORDER — METHOCARBAMOL 500 MG PO TABS
1000.0000 mg | ORAL_TABLET | Freq: Once | ORAL | Status: AC
  Administered 2024-05-03: 1000 mg via ORAL
  Filled 2024-05-03: qty 2

## 2024-05-03 MED ORDER — LIDOCAINE 5 % EX PTCH: 1.0000 | MEDICATED_PATCH | CUTANEOUS | 0 refills | Status: AC

## 2024-05-03 MED ORDER — LIDOCAINE 5 % EX PTCH
1.0000 | MEDICATED_PATCH | Freq: Once | CUTANEOUS | Status: DC
  Administered 2024-05-03: 1 via TRANSDERMAL
  Filled 2024-05-03: qty 1

## 2024-05-03 MED ORDER — KETOROLAC TROMETHAMINE 60 MG/2ML IM SOLN
30.0000 mg | Freq: Once | INTRAMUSCULAR | Status: AC
  Administered 2024-05-03: 30 mg via INTRAMUSCULAR
  Filled 2024-05-03: qty 2

## 2024-05-03 NOTE — ED Triage Notes (Signed)
 Pt from home; c/o right lower back, radiating down right leg since Thursday. States he has been taking ibuprofen  but it doesn't help.

## 2024-05-03 NOTE — Discharge Instructions (Signed)
 You can take ibuprofen  600 mg every 8-12 hours for the next 3 to 5 days and then as needed afterwards.  You can also take Tylenol  to help with pain.  I have sent 2 medications to your pharmacy for you to take as needed.  This should resolve in the next 1 to 2 weeks but if you have any severe worsening symptoms you can call the orthopedic number I have listed in your paperwork for outpatient follow-up.  You can also follow-up with your primary care provider too.  Please return for severe worsening pain that is not controlled at home, numbness in your leg, or other severe concerning features.

## 2024-05-03 NOTE — ED Provider Notes (Signed)
 [facility] Provider Note    Event Date/Time   First MD Initiated Contact with Patient 05/03/24 1243     (approximate)   History   Back Pain   HPI TRAYVEON BECKFORD is a 73 y.o. male with active prostate cancer presenting today for right lower back pain radiating down his leg.  States he noted symptoms approximately 3 days ago.  Worse when he gets up or bends over.  No pain symptoms when at rest.  Has been taking ibuprofen  with unclear benefit.  Denies any numbness or tingling in the leg or significant weakness to the leg.  Denies any trauma to his back but states proximately 1 week ago while working with some equipment at home he was pulled forward quickly but unsure if he heard anything in his back.  Patient is currently being treated for prostate cancer as well.     Physical Exam   Triage Vital Signs: ED Triage Vitals  Encounter Vitals Group     BP 05/03/24 1227 (!) 153/73     Girls Systolic BP Percentile --      Girls Diastolic BP Percentile --      Boys Systolic BP Percentile --      Boys Diastolic BP Percentile --      Pulse Rate 05/03/24 1227 (!) 55     Resp 05/03/24 1227 17     Temp 05/03/24 1227 97.7 F (36.5 C)     Temp Source 05/03/24 1227 Oral     SpO2 05/03/24 1227 98 %     Weight 05/03/24 1225 218 lb 7.6 oz (99.1 kg)     Height 05/03/24 1225 5' 8 (1.727 m)     Head Circumference --      Peak Flow --      Pain Score 05/03/24 1225 6     Pain Loc --      Pain Education --      Exclude from Growth Chart --     Most recent vital signs: Vitals:   05/03/24 1227  BP: (!) 153/73  Pulse: (!) 55  Resp: 17  Temp: 97.7 F (36.5 C)  SpO2: 98%   I have reviewed the vital signs. General:  Awake, alert, no acute distress. Head:  Normocephalic, Atraumatic. EENT:  PERRL, EOMI, Oral mucosa pink and moist, Neck is supple. Cardiovascular: Regular rate, 2+ distal pulses. Respiratory:  Normal respiratory effort, symmetrical expansion, no  distress.   Abdomen: Soft, nontender, nondistended Extremities:  Moving all four extremities through full ROM without pain.  No L-spine tenderness palpation.  Mild tenderness in the right paraspinal muscles.  Positive straight leg raise test on the right side Neuro:  Alert and oriented.  Interacting appropriately.  Sensation equal and intact throughout bilateral lower extremities Skin:  Warm, dry, no rash.   Psych: Appropriate affect.    ED Results / Procedures / Treatments   Labs (all labs ordered are listed, but only abnormal results are displayed) Labs Reviewed - No data to display   EKG    RADIOLOGY Independently interpreted CT of lumbar spine showing multiple areas of disc herniation on the right lumbar spine   PROCEDURES:  Critical Care performed: No  Procedures   MEDICATIONS ORDERED IN ED: Medications  lidocaine  (LIDODERM ) 5 % 1 patch (1 patch Transdermal Patch Applied 05/03/24 1318)  ketorolac  (TORADOL ) injection 30 mg (30 mg Intramuscular Given 05/03/24 1315)  methocarbamol  (ROBAXIN ) tablet 1,000 mg (1,000 mg Oral Given by Other 05/03/24 1313)  IMPRESSION / MDM / ASSESSMENT AND PLAN / ED COURSE  I reviewed the triage vital signs and the nursing notes.                              Differential diagnosis includes, but is not limited to, sciatica, disc herniation, metastatic lesions  Patient's presentation is most consistent with acute complicated illness / injury requiring diagnostic workup.  Patient is a 73 year old male presenting today for atraumatic right lower back pain radiating down his right leg.  His exam seems most consistent with sciatica versus disc herniation with nerve impingement.  No other neurodeficits present today and urinating well.  Will treat with Toradol , Robaxin , and Lidoderm .  However, because he does have active prostate cancer, there is concern for potential metastatic lesions to the lumbar spines will get CT of the lumbar spine for  further evaluation.  CT imaging shows multiple areas of disc herniation in the lumbar spine likely contributing to his symptoms today.  No sclerotic or metastatic lesions otherwise.  Patient reassessed with improvement in symptoms.  He is safe for outpatient follow-up with his PCP and orthopedics as needed.  Sent with Robaxin  and Lidoderm  and recommended use of ibuprofen  and Tylenol  for the next several days.  Agreeable with plan and given strict return precautions.  Clinical Course as of 05/03/24 1530  Sun May 03, 2024  1529 Reassessed with improvement in pain symptoms [DW]    Clinical Course User Index [DW] Malvina Alm DASEN, MD     FINAL CLINICAL IMPRESSION(S) / ED DIAGNOSES   Final diagnoses:  Lumbar disc herniation     Rx / DC Orders   ED Discharge Orders          Ordered    methocarbamol  (ROBAXIN ) 500 MG tablet  Every 8 hours PRN        05/03/24 1530    lidocaine  (LIDODERM ) 5 %  Every 24 hours        05/03/24 1530             Note:  This document was prepared using Dragon voice recognition software and may include unintentional dictation errors.   Malvina Alm DASEN, MD 05/03/24 [phone]

## 2024-05-06 ENCOUNTER — Ambulatory Visit: Attending: Medical

## 2024-05-06 DIAGNOSIS — R0602 Shortness of breath: Secondary | ICD-10-CM

## 2024-05-06 LAB — ECHOCARDIOGRAM COMPLETE
AR max vel: 1.41 cm2
AV Area VTI: 1.57 cm2
AV Area mean vel: 1.4 cm2
AV Mean grad: 13.5 mmHg
AV Peak grad: 25.6 mmHg
Ao pk vel: 2.53 m/s
Area-P 1/2: 4.39 cm2
S' Lateral: 2.69 cm

## 2024-05-12 ENCOUNTER — Other Ambulatory Visit: Payer: Self-pay | Admitting: Urology

## 2024-05-12 DIAGNOSIS — C61 Malignant neoplasm of prostate: Secondary | ICD-10-CM

## 2024-05-14 ENCOUNTER — Other Ambulatory Visit: Payer: Self-pay | Admitting: Urology

## 2024-05-14 DIAGNOSIS — C61 Malignant neoplasm of prostate: Secondary | ICD-10-CM

## 2024-06-04 ENCOUNTER — Encounter: Payer: Self-pay | Admitting: Urology

## 2024-07-03 DIAGNOSIS — C61 Malignant neoplasm of prostate: Secondary | ICD-10-CM | POA: Insufficient documentation

## 2024-07-03 DIAGNOSIS — N401 Enlarged prostate with lower urinary tract symptoms: Secondary | ICD-10-CM | POA: Insufficient documentation

## 2024-07-03 NOTE — Assessment & Plan Note (Addendum)
 s/p IMRT for favorable intermediate risk CAP (July 2025) - followed by Dr. Lenn  Followed by Rad Onc - due for 3 mo follow up since IMRT PSA 0.16 (Sept 2025) -from 6.32 pretreatment

## 2024-07-03 NOTE — Progress Notes (Signed)
   07/13/2024 9:58 AM   Todd Collins 1950/12/15 [id number]  Reason for visit: Follow up prostate Ca   HPI: 73 year old male here for initial follow-up with me, previously seen in this clinic for prostate cancer and LUTS Recently s/p IMRT for favorable intermediate risk CAP (July 2025) - followed by Dr. Lenn   Ongoing LUTS since CaP diagnosis and treatment  - prior Flomax , sanctura , rapaflo , --not currently taking PVR 16cc today   Overall he is doing fairly well today IPSS 7/1 Majority of prior urinary symptoms slowly resolved following completion of IMRT Some lingering intermittent dysuria, nocturia (history of untreated OSA)  Prior HPI: History of low risk GG1 CaP, in 3/12 cores (dx June 2024), PSA ~6 range MRI Fusion (July 2024) - upstaging to GG3, in 2/13 cores- favorable intermediate risk S/p IMRT in (July 2025)   Physical Exam: BP (!) 155/80 (BP Location: Left Arm, Patient Position: Sitting, Cuff Size: Large)   Pulse 72   Ht 5' 8.5 (1.74 m)   Wt 234 lb 8 oz (106.4 kg)   BMI 35.14 kg/m    Constitutional:  Alert and oriented, No acute distress.   Laboratory Data: Component Ref Range & Units (hover) 4 d ago 4 mo ago 5 mo ago 9 mo ago  Prostatic Specific Antigen 0.16 0.75 CM 2.64 CM 6.32 High     Pertinent Imaging: N/A   Assessment & Plan:    Prostate cancer [facility]) Assessment & Plan: s/p IMRT for favorable intermediate risk CAP (July 2025) - followed by Dr. Lenn  Followed by Rad Onc - due for 3 mo follow up since IMRT PSA 0.16 (Sept 2025) -from 6.32 pretreatment   Benign localized prostatic hyperplasia with lower urinary tract symptoms (LUTS) Assessment & Plan: Chronic BPH/LUTS, OAB  - bothersome nocturia Tried Flomax , Sanctura , daily Cialis  -not currently taking 44g prostate by MRI   PVR 16cc today  Overall urinary habits have improved since completion of IMRT.  IPSS 7/1 today minimally bothered.   - Expectant management for now -  Recommend evaluation for OSA, consistent CPAP use--he may see improvements in his isolated nocturia -RTC in 12 months for symptom check, or sooner if new issues arise  Orders: -     Bladder Scan (Post Void Residual) in office       Penne JONELLE Skye, MD  [facility] [address], [address] [place], [place] [address] [phone]

## 2024-07-03 NOTE — Assessment & Plan Note (Addendum)
 Chronic BPH/LUTS, OAB  - bothersome nocturia Tried Flomax , Sanctura , daily Cialis  -not currently taking 44g prostate by MRI   PVR 16cc today  Overall urinary habits have improved since completion of IMRT.  IPSS 7/1 today minimally bothered.   - Expectant management for now - Recommend evaluation for OSA, consistent CPAP use--he may see improvements in his isolated nocturia -RTC in 12 months for symptom check, or sooner if new issues arise

## 2024-07-08 ENCOUNTER — Other Ambulatory Visit: Payer: Self-pay

## 2024-07-08 DIAGNOSIS — C61 Malignant neoplasm of prostate: Secondary | ICD-10-CM

## 2024-07-09 ENCOUNTER — Inpatient Hospital Stay: Attending: Radiation Oncology

## 2024-07-09 DIAGNOSIS — C61 Malignant neoplasm of prostate: Secondary | ICD-10-CM | POA: Insufficient documentation

## 2024-07-09 LAB — CBC (CANCER CENTER ONLY)
HCT: 33.8 % — ABNORMAL LOW (ref 39.0–52.0)
Hemoglobin: 11.7 g/dL — ABNORMAL LOW (ref 13.0–17.0)
MCH: 30.7 pg (ref 26.0–34.0)
MCHC: 34.6 g/dL (ref 30.0–36.0)
MCV: 88.7 fL (ref 80.0–100.0)
Platelet Count: 215 K/uL (ref 150–400)
RBC: 3.81 MIL/uL — ABNORMAL LOW (ref 4.22–5.81)
RDW: 12.6 % (ref 11.5–15.5)
WBC Count: 6 K/uL (ref 4.0–10.5)
nRBC: 0 % (ref 0.0–0.2)

## 2024-07-09 LAB — PSA: Prostatic Specific Antigen: 0.16 ng/mL (ref 0.00–4.00)

## 2024-07-10 ENCOUNTER — Other Ambulatory Visit

## 2024-07-10 DIAGNOSIS — C61 Malignant neoplasm of prostate: Secondary | ICD-10-CM

## 2024-07-13 ENCOUNTER — Ambulatory Visit: Admitting: Urology

## 2024-07-13 ENCOUNTER — Encounter: Payer: Self-pay | Admitting: Urology

## 2024-07-13 VITALS — BP 155/80 | HR 72 | Ht 68.5 in | Wt 234.5 lb

## 2024-07-13 DIAGNOSIS — N401 Enlarged prostate with lower urinary tract symptoms: Secondary | ICD-10-CM

## 2024-07-13 DIAGNOSIS — C61 Malignant neoplasm of prostate: Secondary | ICD-10-CM | POA: Diagnosis not present

## 2024-07-13 LAB — BLADDER SCAN AMB NON-IMAGING

## 2024-07-14 ENCOUNTER — Ambulatory Visit: Admitting: Urology

## 2024-07-16 ENCOUNTER — Encounter: Payer: Self-pay | Admitting: Radiation Oncology

## 2024-07-16 ENCOUNTER — Other Ambulatory Visit: Payer: Self-pay | Admitting: *Deleted

## 2024-07-16 ENCOUNTER — Ambulatory Visit
Admission: RE | Admit: 2024-07-16 | Discharge: 2024-07-16 | Disposition: A | Discharge status: Home / Self Care | Source: Ambulatory Visit | Attending: Radiation Oncology | Admitting: Radiation Oncology

## 2024-07-16 VITALS — BP 142/86 | HR 57 | Temp 98.2°F | Wt 234.7 lb

## 2024-07-16 DIAGNOSIS — Z923 Personal history of irradiation: Secondary | ICD-10-CM | POA: Insufficient documentation

## 2024-07-16 DIAGNOSIS — C61 Malignant neoplasm of prostate: Secondary | ICD-10-CM | POA: Insufficient documentation

## 2024-07-16 DIAGNOSIS — M4807 Spinal stenosis, lumbosacral region: Secondary | ICD-10-CM | POA: Diagnosis not present

## 2024-07-16 NOTE — Progress Notes (Signed)
 Radiation Oncology Follow up Note  Name: Todd Collins   Date:   07/16/2024 MRN:  [id number] DOB: 02-Jan-1951    This 73 y.o. male presents to the clinic today for 38-month follow-up.  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  Seen today in routine follow-up he is doing well.  Specifically denies any increased lower urinary tract symptoms diarrhea or fatigue.  He had a recent PSA which was 0.16 showing excellent biochemical control of his prostate cancer..  COMPLICATIONS OF TREATMENT: none  FOLLOW UP COMPLIANCE: keeps appointments   PHYSICAL EXAM:  BP (!) 142/86   Pulse (!) 57   Temp 98.2 F (36.8 C) (Tympanic)   Wt 234 lb 11.2 oz (106.5 kg)   BMI 35.17 kg/m  Well-developed well-nourished patient in NAD. HEENT reveals PERLA, EOMI, discs not visualized.  Oral cavity is clear. No oral mucosal lesions are identified. Neck is clear without evidence of cervical or supraclavicular adenopathy. Lungs are clear to A&P. Cardiac examination is essentially unremarkable with regular rate and rhythm without murmur rub or thrill. Abdomen is benign with no organomegaly or masses noted. Motor sensory and DTR levels are equal and symmetric in the upper and lower extremities. Cranial nerves II through XII are grossly intact. Proprioception is intact. No peripheral adenopathy or edema is identified. No motor or sensory levels are noted. Crude visual fields are within normal range.  RADIOLOGY RESULTS: No current films for review patient did have CT scan of his lumbar spine showing foraminal stenosis of L3-4 and borderline impingement of L2-3 and L4 S1.  PLAN: Present time patient is doing well under excellent biochemical  control of his prostate cancer.  I am pleased with his overall progress.  I have asked to see him back in 6 months for follow-up with a repeat PSA.  Patient knows to call in anytime with any concerns.  I would like to take this opportunity to thank you for allowing me to participate in the care of your patient.SABRA Marcey Penton, MD

## 2024-07-22 NOTE — Progress Notes (Unsigned)
 Cardiology Office Note   Date:  07/23/2024  ID:  Todd Collins, DOB 09-11-1951, MRN [id number] PCP: Lenon Layman ORN, MD  [facility] Providers Cardiologist:  Todd Lunger, MD    History of Present Illness Todd Collins is a 73 y.o. male with a hx of  CAD with MI in 2012 and subsequent two-vessel CABG with LIMA-LAD and SVG-PDA in 2012 s/p subsequent PCI/DES to occluded D1 03/2011, possible TIA, HTN, HLD with statin intolerance, GERD, gout, steroid-induced hyperglycemia presents today for follow-up of CAD.   Previously followed by [facility] cardiology.  Carotid duplex 2012with no hemodynamically significant stenosis.  Cath 12/2010 50% mid LAD disease, 90% small diagonal disease, 50% proximal RCA disease, 60-70% distal RCA disease.  Echo 01/18/2011 EF 60-65.  Two-vessel CABG in 2012.  Spring 2012 recurrent chest pain with stress test 02/2011 with ST depressions greater than 1 mm during end of study but no symptoms.  Follow-up cardiac cath 03/1111 with 20% lesion in the distal left main, diffusely diseased mid LAD up to 75% 100% proximal occlusion the first diagonal with subsequent PCI/DES, mildly diseased LCx and 80% stenosis of mid RCA and a 95% stenosis in distal RCA.  LIMA to LAD and SVG to PDA were patent.  Repeat stress test 2013 with no evidence of ischemia or scar and EF greater than 65%.  He had prior TIA-like symptoms 06/2013 with head CT normal.  At that time echo, Holter unremarkable.  Nuclear stress test 12/2013 with no ischemia with 1 mm ST depression in leads V5 and V6 concerning for ischemia at peak rest.  EF 69%.  Overall low risk scan.   Echocardiogram 04/07/2020 LVEF 60 to 65%, grade 2 diastolic dysfunction, no RWMA, RV normal size and function, mild MR, very mild aortic valve stenosis with valve area 1.86 cm, mean gradient 10.0.   He has a lot of allergies to medications, amlodipine , HCTZ, Coreg , Zetia , statin, Imdur.   The patient was seen 03/2022 and was overall doing well.  Repeat echo for AI showed LVEF 60-65%, G2DD, mild AI.   The patient was last seen 04/21/24 reporting fatigue and SOB which may be from cancer medication. BP was low and lisinopril  was decreased. Echo was repeated for mild AS.  Echo showed LVEF 55 to 60%, normal diastolic parameters, normal RV function, moderate calcification of the aortic valve, mild AAS with a mean gradient of 13.5 mmHg.  Today, he is feeling better since he quit taking caner medication. He finished radiation as well. Energy is overall better. He reports SOB has improved, but is not back to normal. He had weight gain, but is unsure why. He is eating healthy and has been doing light exercise. He denies chest pain, but did have chest twinge when he felt stressed.  He is requesting refills of cardiac medications.  Studies Reviewed      Echo 04/2024 1. Left ventricular ejection fraction, by estimation, is 55 to 60%. The  left ventricle has normal function. The left ventricle has no regional  wall motion abnormalities. Left ventricular diastolic parameters were  normal.   2. Right ventricular systolic function is normal. The right ventricular  size is normal. Tricuspid regurgitation signal is inadequate for assessing  PA pressure.   3. The mitral valve is normal in structure. No evidence of mitral valve  regurgitation. No evidence of mitral stenosis.   4. The aortic valve is calcified. There is moderate calcification of the  aortic valve. Aortic valve regurgitation is not  visualized. Mild aortic  valve stenosis. Aortic valve mean gradient measures 13.5 mmHg.   5. The inferior vena cava is normal in size with greater than 50%  respiratory variability, suggesting right atrial pressure of 3 mmHg.   Echo 2023  1. Left ventricular ejection fraction, by estimation, is 60 to 65%. The  left ventricle has normal function. The left ventricle has no regional  wall motion abnormalities. There is mild asymmetric left ventricular   hypertrophy of the basal-septal segment.  Left ventricular diastolic parameters are consistent with Grade II  diastolic dysfunction (pseudonormalization). The average left ventricular  global longitudinal strain is -14.2 %.   2. Right ventricular systolic function is normal. The right ventricular  size is normal. There is normal pulmonary artery systolic pressure. The  estimated right ventricular systolic pressure is 33.1 mmHg.   3. The mitral valve is normal in structure. No evidence of mitral valve  regurgitation. No evidence of mitral stenosis.   4. The aortic valve is normal in structure. There is moderate  calcification of the aortic valve. Aortic valve regurgitation is not  visualized. vVery mild aortic valve stenosis. Aortic valve area, by VTI  measures 1.88 cm. Aortic valve mean gradient  measures 12.3 mmHg. Aortic valve Vmax measures 2.23 m/s.   5. The inferior vena cava is normal in size with greater than 50%  respiratory variability, suggesting right atrial pressure of 3 mmHg.   Comparison(s): 04/07/2020 LVEF 60 to 65%, grade 2 diastolic dysfunction, no  RWMA, RV normal size and function, mild MR, very mild aortic valve  stenosis with valve area 1.86 cm, mean gradient 10.0.    Echo 03/2020  1. Left ventricular ejection fraction, by estimation, is 60 to 65%. The  left ventricle has normal function. The left ventricle has no regional  wall motion abnormalities. Left ventricular diastolic parameters are  consistent with Grade II diastolic  dysfunction (pseudonormalization).   2. Right ventricular systolic function is normal. The right ventricular  size is normal.   3. Mild mitral valve regurgitation.   4. The aortic valve was not well visualized. Aortic valve regurgitation  is not visualized. Very mild aortic valve stenosis. Aortic valve area, by  VTI measures 1.86 cm. Aortic valve mean gradient measures 10.0 mmHg.    Echo 04/17/19 1. The left ventricle has normal  systolic function, with an ejection  fraction of 55-60%. The cavity size was normal. Left ventricular diastolic  parameters were normal. No evidence of left ventricular regional wall  motion abnormalities.   2. The right ventricle has normal systolic function. The cavity was  normal. There is no increase in right ventricular wall thickness. Right  ventricular systolic pressure is upper normal to mildly elevated (30 mmHg  plus central venous pressure).   3. Left atrial size was mildly dilated.   4. Right atrial size was mildly dilated.   5. The aortic valve was not well visualized. Mild thickening of the  aortic valve. Mild-moderate stenosis of the aortic valve.   6. The aortic root is normal in size and structure.   7. The interatrial septum was not well visualized.    Myoview Lexiscan 2017 Narrative & Impression  Horizontal ST segment depression ST segment depression was noted during stress in the II, III, aVF, V3, V4, V5 and V6 leads. V6 beginning at 5:35,remaining leads beginning at 6:25   Exercise myocardial perfusion imaging study with no significant  ischemia Normal wall motion, EF estimated at 69% EKG changes notes, 1 mm  depression in V5 and V6, concerning for ischemia at peak stress Target heart rate achieved Low risk scan. Please note EKG abnormality, clinical correlation suggested.     Signed, Velinda Lunger, MD, Ph.D [facility]     Physical Exam VS:  BP 136/76 (BP Location: Left Arm, Patient Position: Sitting, Cuff Size: Normal)   Pulse 73   Wt 228 lb (103.4 kg)   SpO2 98%   BMI 34.16 kg/m        Wt Readings from Last 3 Encounters:  07/23/24 228 lb (103.4 kg)  07/16/24 234 lb 11.2 oz (106.5 kg)  07/13/24 234 lb 8 oz (106.4 kg)    GEN: Well nourished, well developed in no acute distress NECK: No JVD; No carotid bruits CARDIAC: RRR, + murmur, no rubs, gallops RESPIRATORY:  Clear to auscultation without rales, wheezing or rhonchi  ABDOMEN: Soft, non-tender,  non-distended EXTREMITIES:  No edema; No deformity   ASSESSMENT AND PLAN  DOE Patient reports improvement in breathing issues since stopping cancer medication and finishing radiation.  He feels breathing is much better, but still not back to normal.  He is doing light exercise at this time.  Echo showed normal pump function and normal diastolic function with mild AS.  Most recent hemoglobin 11.7.  We will continue to monitor symptoms.  HTN Blood pressure today is 136/76.  Continue lisinopril  20 mg daily and Lopressor  50 mg twice daily.  I will send in refills.  CAD s/p CABG and subsequent PCI native D1 Patient had 1 brief episode of chest discomfort in the setting of stress.  Otherwise, he has no anginal symptoms.  No further ischemic workup at this time.  Continue aspirin 81 mg daily, lisinopril  20 mg daily, Lopressor  50 mg twice daily, sublingual nitroglycerin , and fish oil.  Mild AS Echo showed normal pump function with mild AS, mean gradient 13.5 mmHg.  Can repeat in 2-3 years.  Hyperlipidemia LDL 133, triglycerides 374, total cholesterol 239.  Patient reports history of myalgia on statin and intolerance to Zetia .  He is not interested in injectables at this time.  He takes fish oil.     Dispo: Follow-up in 6 months  Signed, Natlie Asfour VEAR Fishman, PA-C

## 2024-07-23 ENCOUNTER — Encounter: Payer: Self-pay | Admitting: Medical

## 2024-07-23 ENCOUNTER — Ambulatory Visit: Attending: Medical | Admitting: Medical

## 2024-07-23 VITALS — BP 136/76 | HR 73 | Wt 228.0 lb

## 2024-07-23 DIAGNOSIS — I10 Essential (primary) hypertension: Secondary | ICD-10-CM | POA: Diagnosis not present

## 2024-07-23 DIAGNOSIS — R0609 Other forms of dyspnea: Secondary | ICD-10-CM | POA: Diagnosis not present

## 2024-07-23 DIAGNOSIS — I25119 Atherosclerotic heart disease of native coronary artery with unspecified angina pectoris: Secondary | ICD-10-CM | POA: Diagnosis not present

## 2024-07-23 DIAGNOSIS — E782 Mixed hyperlipidemia: Secondary | ICD-10-CM

## 2024-07-23 DIAGNOSIS — I350 Nonrheumatic aortic (valve) stenosis: Secondary | ICD-10-CM

## 2024-07-23 MED ORDER — ASPIRIN EC 81 MG PO TBEC: 81.0000 mg | DELAYED_RELEASE_TABLET | Freq: Every day | ORAL | Status: AC

## 2024-07-23 MED ORDER — LISINOPRIL 20 MG PO TABS: 20.0000 mg | ORAL_TABLET | Freq: Every day | ORAL | 3 refills | Status: AC

## 2024-07-23 MED ORDER — NITROGLYCERIN 0.4 MG SL SUBL: 0.4000 mg | SUBLINGUAL_TABLET | SUBLINGUAL | 3 refills | Status: AC | PRN

## 2024-07-23 MED ORDER — METOPROLOL TARTRATE 50 MG PO TABS: 50.0000 mg | ORAL_TABLET | Freq: Two times a day (BID) | ORAL | 3 refills | Status: AC

## 2024-07-23 NOTE — Patient Instructions (Addendum)
 Medication Instructions:  Your physician recommends that you continue on your current medications as directed. Please refer to the Current Medication list given to you today.   Refills on Metoprolol , Lisinopril , Nitroglycerin  tabs,  sent to [facility], Arlyss Lanius may obtain Aspirin 81 mg over the counter *If you need a refill on your cardiac medications before your next appointment, please call your pharmacy*  Lab Work: No labs ordered today  If you have labs (blood work) drawn today and your tests are completely normal, you will receive your results only by: MyChart Message (if you have MyChart) OR A paper copy in the mail If you have any lab test that is abnormal or we need to change your treatment, we will call you to review the results.  Testing/Procedures: No test ordered today   Follow-Up: At [facility], you and your health needs are our priority.  As part of our continuing mission to provide you with exceptional heart care, our providers are all part of one team.  This team includes your primary Cardiologist (physician) and Advanced Practice Providers or APPs (Physician Assistants and Nurse Practitioners) who all work together to provide you with the care you need, when you need it.  Your next appointment:   6 month(s)  Provider:   You may see Timothy Gollan, MD or one of the following Advanced Practice Providers on your designated Care Team:    Cadence Franchester, [place]   We recommend signing up for the patient portal called MyChart.  Sign up information is provided on this After Visit Summary.  MyChart is used to connect with patients for Virtual Visits (Telemedicine).  Patients are able to view lab/test results, encounter notes, upcoming appointments, etc.  Non-urgent messages can be sent to your provider as well.   To learn more about what you can do with MyChart, go to [email or web address].

## 2024-10-29 NOTE — H&P (Signed)
 "  Pre-Procedure H&P   Patient ID: Todd Collins is a 74 y.o. male.  Gastroenterology Provider: Elspeth Ozell Jungling, DO  Referring Provider: Dr. Lenon PCP: Lenon Layman ORN, MD  Date: 10/30/2024  HPI Todd Collins is a 74 y.o. male who presents today for Colonoscopy for Colorectal cancer screening; family history of colon polyps- brother .  Family history of colon polyps in the patient's brother. Last underwent colonoscopy in 08/2013 with diverticulosis and both internal and external hemorrhoids noted.  Normal terminal ileum.  No polyps  Reports regular bowel moods without melena or hematochezia   Past Medical History:  Diagnosis Date   CAD (coronary artery disease)    a. 12/2010 s/p 2 vessel CABG x 2 @ [facility] (LIMA-LAD, SVG-PDA); b. cath 03/2011: LM 20%, mLAD 75%, pD1 100% (s/p PCI/DES), mildly diseaed LCx, mRCA 80%, dRCA 95%, patent grafts; c. 12/2015 Ex MV: ex time 7:15, max HR 153.  EF 69%, 1mm lat ST  dep, no ischemia.   Depression    Diastolic dysfunction    a. 05/2022 Echo: EF 60-65%, no rwma, GrII DD, nl RV fxn, RVSP 33.26mmHg, mild AS.   Gout    Hyperlipidemia    Hypertension    Lyme disease    Prostate cancer (HCC)    Statin intolerance    TIA (transient ischemic attack) 07/2013    Past Surgical History:  Procedure Laterality Date   CARDIAC CATHETERIZATION  12/25/2010   CORONARY ANGIOPLASTY     stent x 1    CORONARY ARTERY BYPASS GRAFT  11/2010   [facility]   right index finger surgery      Family History Colon polyps-Brother No other h/o GI disease or malignancy  Review of Systems  Constitutional:  Negative for activity change, appetite change, chills, diaphoresis, fatigue, fever and unexpected weight change.  HENT:  Negative for trouble swallowing and voice change.   Respiratory:  Negative for shortness of breath and wheezing.   Cardiovascular:  Negative for chest pain, palpitations and leg swelling.  Gastrointestinal:  Negative for abdominal  distention, abdominal pain, anal bleeding, blood in stool, constipation, diarrhea, nausea and vomiting.  Musculoskeletal:  Negative for arthralgias and myalgias.  Skin:  Negative for color change and pallor.  Neurological:  Negative for dizziness, syncope and weakness.  Psychiatric/Behavioral:  Negative for confusion. The patient is not nervous/anxious.   All other systems reviewed and are negative.    Medications Medications Ordered Prior to Encounter[1]  Pertinent medications related to GI and procedure were reviewed by me with the patient prior to the procedure  Current Medications[2]  sodium chloride  20 mL/hr at 10/30/24 0936       Allergies[3] Allergies were reviewed by me prior to the procedure  Objective   Body mass index is 32.81 kg/m. Vitals:   10/30/24 1013 10/30/24 1016 10/30/24 1021 10/30/24 1035  BP: (!) 71/33 (!) 89/57 (!) 107/58 111/64  Pulse: 62 (!) 57 72 (!) 58  Resp: (!) 21 (!) 26 16 18   Temp:      TempSrc:      SpO2: 95% 96% 96% 100%  Weight:      Height:         Physical Exam Vitals and nursing note reviewed.  Constitutional:      General: He is not in acute distress.    Appearance: Normal appearance. He is obese. He is not ill-appearing, toxic-appearing or diaphoretic.  HENT:     Head: Normocephalic and atraumatic.  Nose: Nose normal.     Mouth/Throat:     Mouth: Mucous membranes are moist.     Pharynx: Oropharynx is clear.  Eyes:     General: No scleral icterus.    Extraocular Movements: Extraocular movements intact.  Cardiovascular:     Rate and Rhythm: Normal rate and regular rhythm.     Heart sounds: Normal heart sounds. No murmur heard.    No friction rub. No gallop.  Pulmonary:     Effort: Pulmonary effort is normal. No respiratory distress.     Breath sounds: Normal breath sounds. No wheezing, rhonchi or rales.  Abdominal:     General: Bowel sounds are normal. There is no distension.     Palpations: Abdomen is soft.      Tenderness: There is no abdominal tenderness. There is no guarding or rebound.  Musculoskeletal:     Cervical back: Neck supple.     Right lower leg: No edema.     Left lower leg: No edema.  Skin:    General: Skin is warm and dry.     Coloration: Skin is not jaundiced or pale.  Neurological:     General: No focal deficit present.     Mental Status: He is alert and oriented to person, place, and time. Mental status is at baseline.  Psychiatric:        Mood and Affect: Mood normal.        Behavior: Behavior normal.        Thought Content: Thought content normal.        Judgment: Judgment normal.      Assessment:  Todd Collins is a 74 y.o. male  who presents today for Colonoscopy for Colorectal cancer screening; family history of colon polyps- brother .  Plan:  Colonoscopy with possible intervention today  Colonoscopy with possible biopsy, control of bleeding, polypectomy, and interventions as necessary has been discussed with the patient/patient representative. Informed consent was obtained from the patient/patient representative after explaining the indication, nature, and risks of the procedure including but not limited to death, bleeding, perforation, missed neoplasm/lesions, cardiorespiratory compromise, and reaction to medications. Opportunity for questions was given and appropriate answers were provided. Patient/patient representative has verbalized understanding is amenable to undergoing the procedure.   Elspeth Ozell Jungling, DO  [facility]  Portions of the record may have been created with voice recognition software. Occasional wrong-word or 'sound-a-like' substitutions may have occurred due to the inherent limitations of voice recognition software.  Read the chart carefully and recognize, using context, where substitutions may have occurred.      [1]  No current facility-administered medications on file prior to encounter.   Current  Outpatient Medications on File Prior to Encounter  Medication Sig Dispense Refill   lisinopril  (ZESTRIL ) 20 MG tablet Take 1 tablet (20 mg total) by mouth daily. 90 tablet 3   metoprolol  tartrate (LOPRESSOR ) 50 MG tablet Take 1 tablet (50 mg total) by mouth 2 (two) times daily. 180 tablet 3   allopurinol (ZYLOPRIM) 150 mg TABS tablet Take 150 mg by mouth daily.     aspirin  EC 81 MG tablet Take 1 tablet (81 mg total) by mouth daily.     Cyanocobalamin (VITAMIN B12 PO) Take 1,000 mcg by mouth daily.     methocarbamol  (ROBAXIN ) 500 MG tablet Take 1 tablet (500 mg total) by mouth every 8 (eight) hours as needed. (Patient not taking: Reported on 07/23/2024) 30 tablet 0   nitroGLYCERIN  (NITROSTAT ) 0.4  MG SL tablet Place 1 tablet (0.4 mg total) under the tongue every 5 (five) minutes as needed for chest pain. 25 tablet 3   Omega-3 Fatty Acids (FISH OIL) 1000 MG CAPS Take by mouth 2 (two) times daily.     predniSONE (DELTASONE) 10 MG tablet Take by mouth. (Patient not taking: Reported on 07/23/2024)    [2]  Current Facility-Administered Medications:    0.9 %  sodium chloride  infusion, , Intravenous, Continuous, Onita Elspeth Sharper, DO, Last Rate: 20 mL/hr at 10/30/24 9063, Continued from Pre-op at 10/30/24 0936  Current Outpatient Medications:    lisinopril  (ZESTRIL ) 20 MG tablet, Take 1 tablet (20 mg total) by mouth daily., Disp: 90 tablet, Rfl: 3   metoprolol  tartrate (LOPRESSOR ) 50 MG tablet, Take 1 tablet (50 mg total) by mouth 2 (two) times daily., Disp: 180 tablet, Rfl: 3   allopurinol (ZYLOPRIM) 150 mg TABS tablet, Take 150 mg by mouth daily., Disp: , Rfl:    aspirin  EC 81 MG tablet, Take 1 tablet (81 mg total) by mouth daily., Disp: , Rfl:    Cyanocobalamin (VITAMIN B12 PO), Take 1,000 mcg by mouth daily., Disp: , Rfl:    methocarbamol  (ROBAXIN ) 500 MG tablet, Take 1 tablet (500 mg total) by mouth every 8 (eight) hours as needed. (Patient not taking: Reported on 07/23/2024), Disp: 30 tablet, Rfl:  0   nitroGLYCERIN  (NITROSTAT ) 0.4 MG SL tablet, Place 1 tablet (0.4 mg total) under the tongue every 5 (five) minutes as needed for chest pain., Disp: 25 tablet, Rfl: 3   Omega-3 Fatty Acids (FISH OIL) 1000 MG CAPS, Take by mouth 2 (two) times daily., Disp: , Rfl:    predniSONE (DELTASONE) 10 MG tablet, Take by mouth. (Patient not taking: Reported on 07/23/2024), Disp: , Rfl:  [3]  Allergies Allergen Reactions   Definity  [Perflutren  Lipid Microsphere] Other (See Comments)    Patient experiences severe low back pain for 30-45 seconds after injection   Tamsulosin  Shortness Of Breath   Carvedilol  Itching and Swelling   Bupropion Other (See Comments)    Other reaction(s): RASH Upper extremity edema Other reaction(s): RASH Upper extremity edema Other reaction(s): RASH   Omeprazole Other (See Comments)    Other reaction(s): Other (See Comments) Caused muscle cramps Other reaction(s): Other (See Comments) Caused muscle cramps   Prednisone Other (See Comments)    Elevated blood sugar  Other reaction(s): SHORTNESS OF BREATH Elevated blood sugars Elevated blood sugar Other reaction(s): SHORTNESS OF BREATH   Statins     Other reaction(s): Muscle Pain   Trazodone Other (See Comments)    Shortness of breath  wheezing   Trazodone And Nefazodone     Shortness of breath  wheezing   "

## 2024-10-30 ENCOUNTER — Encounter
Admission: RE | Disposition: A | Discharge status: Home / Self Care | Payer: Self-pay | Source: Home / Self Care | Attending: Gastroenterology

## 2024-10-30 ENCOUNTER — Ambulatory Visit: Admitting: Anesthesiology

## 2024-10-30 ENCOUNTER — Encounter: Payer: Self-pay | Admitting: Gastroenterology

## 2024-10-30 ENCOUNTER — Ambulatory Visit
Admission: RE | Admit: 2024-10-30 | Discharge: 2024-10-30 | Disposition: A | Discharge status: Home / Self Care | Attending: Gastroenterology | Admitting: Gastroenterology

## 2024-10-30 DIAGNOSIS — Z1211 Encounter for screening for malignant neoplasm of colon: Secondary | ICD-10-CM | POA: Insufficient documentation

## 2024-10-30 DIAGNOSIS — Z955 Presence of coronary angioplasty implant and graft: Secondary | ICD-10-CM | POA: Diagnosis not present

## 2024-10-30 DIAGNOSIS — I10 Essential (primary) hypertension: Secondary | ICD-10-CM | POA: Diagnosis not present

## 2024-10-30 DIAGNOSIS — D123 Benign neoplasm of transverse colon: Secondary | ICD-10-CM | POA: Insufficient documentation

## 2024-10-30 DIAGNOSIS — I350 Nonrheumatic aortic (valve) stenosis: Secondary | ICD-10-CM | POA: Diagnosis not present

## 2024-10-30 DIAGNOSIS — Z951 Presence of aortocoronary bypass graft: Secondary | ICD-10-CM | POA: Diagnosis not present

## 2024-10-30 DIAGNOSIS — G4730 Sleep apnea, unspecified: Secondary | ICD-10-CM | POA: Diagnosis not present

## 2024-10-30 DIAGNOSIS — Z83719 Family history of colon polyps, unspecified: Secondary | ICD-10-CM | POA: Diagnosis not present

## 2024-10-30 DIAGNOSIS — E66813 Obesity, class 3: Secondary | ICD-10-CM | POA: Diagnosis not present

## 2024-10-30 DIAGNOSIS — D120 Benign neoplasm of cecum: Secondary | ICD-10-CM | POA: Diagnosis not present

## 2024-10-30 DIAGNOSIS — K5730 Diverticulosis of large intestine without perforation or abscess without bleeding: Secondary | ICD-10-CM | POA: Insufficient documentation

## 2024-10-30 DIAGNOSIS — Z87891 Personal history of nicotine dependence: Secondary | ICD-10-CM | POA: Insufficient documentation

## 2024-10-30 DIAGNOSIS — I2510 Atherosclerotic heart disease of native coronary artery without angina pectoris: Secondary | ICD-10-CM | POA: Insufficient documentation

## 2024-10-30 DIAGNOSIS — K640 First degree hemorrhoids: Secondary | ICD-10-CM | POA: Diagnosis not present

## 2024-10-30 DIAGNOSIS — D122 Benign neoplasm of ascending colon: Secondary | ICD-10-CM | POA: Diagnosis not present

## 2024-10-30 DIAGNOSIS — K635 Polyp of colon: Secondary | ICD-10-CM | POA: Insufficient documentation

## 2024-10-30 DIAGNOSIS — Z6832 Body mass index (BMI) 32.0-32.9, adult: Secondary | ICD-10-CM | POA: Diagnosis not present

## 2024-10-30 HISTORY — PX: COLONOSCOPY: SHX5424

## 2024-10-30 HISTORY — PX: POLYPECTOMY: SHX149

## 2024-10-30 SURGERY — COLONOSCOPY
Anesthesia: General

## 2024-10-30 MED ORDER — PROPOFOL 10 MG/ML IV BOLUS
INTRAVENOUS | Status: AC
  Filled 2024-10-30: qty 20

## 2024-10-30 MED ORDER — PROPOFOL 10 MG/ML IV BOLUS
INTRAVENOUS | Status: DC | PRN
  Administered 2024-10-30: 30 mg via INTRAVENOUS
  Administered 2024-10-30: 120 mg via INTRAVENOUS

## 2024-10-30 MED ORDER — EPHEDRINE SULFATE-NACL 50-0.9 MG/10ML-% IV SOSY
PREFILLED_SYRINGE | INTRAVENOUS | Status: DC | PRN
  Administered 2024-10-30: 10 mg via INTRAVENOUS
  Administered 2024-10-30 (×2): 5 mg via INTRAVENOUS

## 2024-10-30 MED ORDER — LIDOCAINE HCL (CARDIAC) PF 100 MG/5ML IV SOSY
PREFILLED_SYRINGE | INTRAVENOUS | Status: DC | PRN
  Administered 2024-10-30: 50 mg via INTRAVENOUS

## 2024-10-30 MED ORDER — EPHEDRINE 5 MG/ML INJ
INTRAVENOUS | Status: AC
  Filled 2024-10-30: qty 5

## 2024-10-30 MED ORDER — SODIUM CHLORIDE 0.9 % IV SOLN: INTRAVENOUS | Status: DC

## 2024-10-30 MED ORDER — PROPOFOL 500 MG/50ML IV EMUL
INTRAVENOUS | Status: DC | PRN
  Administered 2024-10-30: 140 ug/kg/min via INTRAVENOUS

## 2024-10-30 NOTE — Anesthesia Postprocedure Evaluation (Signed)
"   Anesthesia Post Note  Patient: Todd Collins  Procedure(s) Performed: COLONOSCOPY POLYPECTOMY, INTESTINE  Patient location during evaluation: PACU Anesthesia Type: General Level of consciousness: awake and alert Pain management: satisfactory to patient Vital Signs Assessment: post-procedure vital signs reviewed and stable Respiratory status: nonlabored ventilation Cardiovascular status: stable Anesthetic complications: no   No notable events documented.   Last Vitals:  Vitals:   10/30/24 1021 10/30/24 1035  BP: (!) 107/58 111/64  Pulse: 72 (!) 58  Resp: 16 18  Temp:    SpO2: 96% 100%    Last Pain:  Vitals:   10/30/24 1035  TempSrc:   PainSc: 0-No pain                 VAN STAVEREN,Mikko Lewellen      "

## 2024-10-30 NOTE — Interval H&P Note (Signed)
 History and Physical Interval Note: Preprocedure H&P from 10/30/2024  was reviewed and there was no interval change after seeing and examining the patient.  Written consent was obtained from the patient after discussion of risks, benefits, and alternatives. Patient has consented to proceed with Colonoscopy with possible intervention   10/30/2024 2:21 PM  Todd Collins  has presented today for surgery, with the diagnosis of Family history of colonic polyps (Z83.719) Colon cancer screening (Z12.11).  The various methods of treatment have been discussed with the patient and family. After consideration of risks, benefits and other options for treatment, the patient has consented to  Procedures: COLONOSCOPY (N/A) POLYPECTOMY, INTESTINE as a surgical intervention.  The patient's history has been reviewed, patient examined, no change in status, stable for surgery.  I have reviewed the patient's chart and labs.  Questions were answered to the patient's satisfaction.     Elspeth Ozell Jungling

## 2024-10-30 NOTE — Transfer of Care (Signed)
 Immediate Anesthesia Transfer of Care Note  Patient: Todd Collins  Procedure(s) Performed: COLONOSCOPY POLYPECTOMY, INTESTINE  Patient Location: Endoscopy Unit  Anesthesia Type:General  Level of Consciousness: awake  Airway & Oxygen Therapy: Patient Spontanous Breathing  Post-op Assessment: Report given to RN and Post -op Vital signs reviewed and stable  Post vital signs: Reviewed and stable  Last Vitals:  Vitals Value Taken Time  BP 89/57 10/30/24 10:16  Temp 35.7 C 10/30/24 10:11  Pulse 71 10/30/24 10:16  Resp 21 10/30/24 10:16  SpO2 96 % 10/30/24 10:16  Vitals shown include unfiled device data.  Last Pain:  Vitals:   10/30/24 1016  TempSrc:   PainSc: 0-No pain         Complications: No notable events documented.

## 2024-10-30 NOTE — Op Note (Signed)
 [facility] Gastroenterology Patient Name: Todd Collins Procedure Date: 10/30/2024 9:23 AM MRN: [id number] Account #: [id number] Date of Birth: 01/04/1951 Admit Type: Outpatient Age: 74 Room: [facility] ENDO ROOM 1 Gender: Male Note Status: Finalized Instrument Name: Colon Scope [id number] Procedure:             Colonoscopy Indications:           Colon cancer screening in patient at increased risk:                         Family history of 1st-degree relative with colon polyps Providers:             Elspeth Ozell Jungling DO, DO Medicines:             Monitored Anesthesia Care Complications:         No immediate complications. Estimated blood loss:                         Minimal. Procedure:             Pre-Anesthesia Assessment:                        - Prior to the procedure, a History and Physical was                         performed, and patient medications and allergies were                         reviewed. The patient is competent. The risks and                         benefits of the procedure and the sedation options and                         risks were discussed with the patient. All questions                         were answered and informed consent was obtained.                         Patient identification and proposed procedure were                         verified by the physician, the nurse, the anesthetist                         and the technician in the endoscopy suite. Mental                         Status Examination: alert and oriented. Airway                         Examination: normal oropharyngeal airway and neck                         mobility. Respiratory Examination: clear to                         auscultation. CV Examination: RRR,  no murmurs, no S3                         or S4. Prophylactic Antibiotics: The patient does not                         require prophylactic antibiotics. Prior                         Anticoagulants: The  patient has taken no anticoagulant                         or antiplatelet agents. ASA Grade Assessment: II - A                         patient with mild systemic disease. After reviewing                         the risks and benefits, the patient was deemed in                         satisfactory condition to undergo the procedure. The                         anesthesia plan was to use monitored anesthesia care                         (MAC). Immediately prior to administration of                         medications, the patient was re-assessed for adequacy                         to receive sedatives. The heart rate, respiratory                         rate, oxygen saturations, blood pressure, adequacy of                         pulmonary ventilation, and response to care were                         monitored throughout the procedure. The physical                         status of the patient was re-assessed after the                         procedure.                        After obtaining informed consent, the colonoscope was                         passed under direct vision. Throughout the procedure,                         the patient's blood pressure, pulse, and oxygen  saturations were monitored continuously. The                         Colonoscope was introduced through the anus and                         advanced to the the cecum, identified by appendiceal                         orifice and ileocecal valve. The colonoscopy was                         performed without difficulty. The patient tolerated                         the procedure well. The quality of the bowel                         preparation was evaluated using the BBPS [facility] Bowel                         Preparation Scale) with scores of: Right Colon = 3,                         Transverse Colon = 3 and Left Colon = 3 (entire mucosa                         seen well with no residual  staining, small fragments                         of stool or opaque liquid). The total BBPS score                         equals 9. The ileocecal valve, appendiceal orifice,                         and rectum were photographed. Findings:      The perianal and digital rectal examinations were normal. Pertinent       negatives include normal sphincter tone.      Multiple small-mouthed diverticula were found in the left colon.       Estimated blood loss: none.      Four sessile polyps were found in the transverse colon and ascending       colon. The polyps were 1 to 2 mm in size. These polyps were removed with       a jumbo cold forceps. Resection and retrieval were complete. Estimated       blood loss was minimal.      Two sessile polyps were found in the cecum and appendiceal orifice. The       polyps were 3 to 5 mm in size. These polyps were removed with a cold       snare. Resection and retrieval were complete. Estimated blood loss was       minimal.      Non-bleeding external and internal hemorrhoids were found during       retroflexion. The hemorrhoids were Grade I (internal hemorrhoids that do       not prolapse). Estimated blood loss:  none.      The exam was otherwise without abnormality on direct and retroflexion       views. Impression:            - Diverticulosis in the left colon.                        - Four 1 to 2 mm polyps in the transverse colon and in                         the ascending colon, removed with a jumbo cold                         forceps. Resected and retrieved.                        - Two 3 to 5 mm polyps in the cecum and at the                         appendiceal orifice, removed with a cold snare.                         Resected and retrieved.                        - Non-bleeding internal hemorrhoids.                        - The examination was otherwise normal on direct and                         retroflexion views. Recommendation:        -  Patient has a contact number available for                         emergencies. The signs and symptoms of potential                         delayed complications were discussed with the patient.                         Return to normal activities tomorrow. Written                         discharge instructions were provided to the patient.                        - Discharge patient to home.                        - Resume previous diet.                        - Continue present medications.                        - Await pathology results.                        - Repeat colonoscopy for surveillance based on  pathology results.                        - Return to referring physician as previously                         scheduled.                        - The findings and recommendations were discussed with                         the patient. Procedure Code(s):     --- Professional ---                        [phone], Colonoscopy, flexible; with removal of                         tumor(s), polyp(s), or other lesion(s) by snare                         technique                        [id number], 59, Colonoscopy, flexible; with biopsy, single                         or multiple Diagnosis Code(s):     --- Professional ---                        Z83.71, Family history of colonic polyps                        K64.0, First degree hemorrhoids                        D12.3, Benign neoplasm of transverse colon (hepatic                         flexure or splenic flexure)                        D12.2, Benign neoplasm of ascending colon                        D12.0, Benign neoplasm of cecum                        D12.1, Benign neoplasm of appendix                        K57.30, Diverticulosis of large intestine without                         perforation or abscess without bleeding CPT copyright 2022 American [employer]. All rights reserved. The codes documented in this  report are preliminary and upon coder review may  be revised to meet current compliance requirements. Attending Participation:      I personally performed the entire procedure. Elspeth Jungling, DO Elspeth Ozell Jungling DO, DO 10/30/2024 10:22:55 AM This report has been signed electronically. Number of Addenda: 0 Note Initiated On: 10/30/2024 9:23  AM Scope Withdrawal Time: 0 hours 19 minutes 25 seconds  Total Procedure Duration: 0 hours 25 minutes 22 seconds  Estimated Blood Loss:  Estimated blood loss was minimal.      [facility]

## 2024-10-30 NOTE — Anesthesia Preprocedure Evaluation (Signed)
 "                                  Anesthesia Evaluation  Patient identified by MRN, date of birth, ID band Patient awake    Reviewed: Allergy & Precautions, NPO status , Patient's Chart, lab work & pertinent test results  Airway Mallampati: II  TM Distance: >3 FB Neck ROM: Full    Dental  (+) Upper Dentures, Lower Dentures   Pulmonary neg pulmonary ROS, sleep apnea , former smoker   Pulmonary exam normal        Cardiovascular Exercise Tolerance: Good hypertension, Pt. on medications + CAD, + Cardiac Stents and + CABG  negative cardio ROS Normal cardiovascular exam Rhythm:Regular Rate:Normal  Mild aortic stenosis   Neuro/Psych    Depression    TIAnegative neurological ROS  negative psych ROS   GI/Hepatic negative GI ROS, Neg liver ROS,,,  Endo/Other  negative endocrine ROS  Class 3 obesity  Renal/GU negative Renal ROS  negative genitourinary   Musculoskeletal   Abdominal  (+) + obese  Peds negative pediatric ROS (+)  Hematology negative hematology ROS (+)   Anesthesia Other Findings Past Medical History: No date: CAD (coronary artery disease)     Comment:  a. 12/2010 s/p 2 vessel CABG x 2 @ [facility] (LIMA-LAD,               SVG-PDA); b. cath 03/2011: LM 20%, mLAD 75%, pD1 100% (s/p              PCI/DES), mildly diseaed LCx, mRCA 80%, dRCA 95%, patent               grafts; c. 12/2015 Ex MV: ex time 7:15, max HR 153.  EF               69%, 1mm lat ST  dep, no ischemia. No date: Depression No date: Diastolic dysfunction     Comment:  a. 05/2022 Echo: EF 60-65%, no rwma, GrII DD, nl RV fxn,               RVSP 33.51mmHg, mild AS. No date: Gout No date: Hyperlipidemia No date: Hypertension No date: Lyme disease No date: Prostate cancer (HCC) No date: Statin intolerance 07/2013: TIA (transient ischemic attack)  Past Surgical History: 12/25/2010: CARDIAC CATHETERIZATION No date: CORONARY ANGIOPLASTY     Comment:  stent x 1  11/2010: CORONARY ARTERY  BYPASS GRAFT     Comment:  [facility] No date: right index finger surgery  BMI    Body Mass Index: 32.81 kg/m      Reproductive/Obstetrics negative OB ROS                              Anesthesia Physical Anesthesia Plan  ASA: 3  Anesthesia Plan: General   Post-op Pain Management:    Induction: Intravenous  PONV Risk Score and Plan: Propofol  infusion and TIVA  Airway Management Planned: Natural Airway and Nasal Cannula  Additional Equipment:   Intra-op Plan:   Post-operative Plan:   Informed Consent: I have reviewed the patients History and Physical, chart, labs and discussed the procedure including the risks, benefits and alternatives for the proposed anesthesia with the patient or authorized representative who has indicated his/her understanding and acceptance.     Dental Advisory Given  Plan Discussed with: CRNA  Anesthesia Plan Comments:  Anesthesia Quick Evaluation  "

## 2024-11-02 LAB — SURGICAL PATHOLOGY

## 2025-01-05 ENCOUNTER — Other Ambulatory Visit

## 2025-01-11 ENCOUNTER — Ambulatory Visit: Admitting: Urology

## 2025-01-13 ENCOUNTER — Ambulatory Visit: Admitting: Radiation Oncology
